# Patient Record
Sex: Female | Born: 1955 | Race: White | Marital: Married | Smoking: Never smoker
Health system: Southern US, Community
[De-identification: ages and names within clinical notes are randomized; demographics above are authoritative.]

## PROBLEM LIST (undated history)

## (undated) DIAGNOSIS — Z860100 Personal history of colon polyps, unspecified: Secondary | ICD-10-CM

## (undated) DIAGNOSIS — I10 Essential (primary) hypertension: Secondary | ICD-10-CM

## (undated) DIAGNOSIS — G43909 Migraine, unspecified, not intractable, without status migrainosus: Secondary | ICD-10-CM

## (undated) DIAGNOSIS — G510 Bell's palsy: Secondary | ICD-10-CM

## (undated) DIAGNOSIS — M1990 Unspecified osteoarthritis, unspecified site: Secondary | ICD-10-CM

## (undated) DIAGNOSIS — Z8719 Personal history of other diseases of the digestive system: Secondary | ICD-10-CM

## (undated) DIAGNOSIS — E119 Type 2 diabetes mellitus without complications: Secondary | ICD-10-CM

## (undated) DIAGNOSIS — J9801 Acute bronchospasm: Secondary | ICD-10-CM

## (undated) DIAGNOSIS — D510 Vitamin B12 deficiency anemia due to intrinsic factor deficiency: Secondary | ICD-10-CM

## (undated) DIAGNOSIS — E785 Hyperlipidemia, unspecified: Secondary | ICD-10-CM

## (undated) DIAGNOSIS — T7840XA Allergy, unspecified, initial encounter: Secondary | ICD-10-CM

## (undated) DIAGNOSIS — K219 Gastro-esophageal reflux disease without esophagitis: Secondary | ICD-10-CM

## (undated) DIAGNOSIS — K5792 Diverticulitis of intestine, part unspecified, without perforation or abscess without bleeding: Secondary | ICD-10-CM

## (undated) DIAGNOSIS — J45909 Unspecified asthma, uncomplicated: Secondary | ICD-10-CM

## (undated) DIAGNOSIS — G4700 Insomnia, unspecified: Secondary | ICD-10-CM

## (undated) DIAGNOSIS — K589 Irritable bowel syndrome without diarrhea: Secondary | ICD-10-CM

## (undated) HISTORY — DX: Acute bronchospasm: J98.01

## (undated) HISTORY — DX: Insomnia, unspecified: G47.00

## (undated) HISTORY — DX: Bell's palsy: G51.0

## (undated) HISTORY — PX: BUNIONECTOMY: SHX129

## (undated) HISTORY — DX: Diverticulitis of intestine, part unspecified, without perforation or abscess without bleeding: K57.92

## (undated) HISTORY — DX: Hyperlipidemia, unspecified: E78.5

## (undated) HISTORY — DX: Migraine, unspecified, not intractable, without status migrainosus: G43.909

## (undated) HISTORY — PX: CATARACT EXTRACTION: SUR2

## (undated) HISTORY — DX: Allergy, unspecified, initial encounter: T78.40XA

## (undated) HISTORY — DX: Personal history of colon polyps, unspecified: Z86.0100

## (undated) HISTORY — DX: Personal history of other diseases of the digestive system: Z87.19

## (undated) HISTORY — DX: Unspecified osteoarthritis, unspecified site: M19.90

## (undated) HISTORY — DX: Vitamin B12 deficiency anemia due to intrinsic factor deficiency: D51.0

## (undated) HISTORY — DX: Irritable bowel syndrome, unspecified: K58.9

## (undated) HISTORY — DX: Gastro-esophageal reflux disease without esophagitis: K21.9

## (undated) HISTORY — PX: KNEE SURGERY: SHX244

## (undated) HISTORY — DX: Personal history of colonic polyps: Z86.010

---

## 1990-09-19 HISTORY — PX: ABDOMINAL HYSTERECTOMY: SHX81

## 2010-12-19 HISTORY — PX: CHOLECYSTECTOMY: SHX55

## 2011-01-06 ENCOUNTER — Other Ambulatory Visit (HOSPITAL_COMMUNITY): Payer: Self-pay | Admitting: Gastroenterology

## 2011-01-06 DIAGNOSIS — R1084 Generalized abdominal pain: Secondary | ICD-10-CM

## 2011-01-10 ENCOUNTER — Encounter (HOSPITAL_COMMUNITY)
Admission: RE | Admit: 2011-01-10 | Discharge: 2011-01-10 | Disposition: A | Discharge status: Home / Self Care | Payer: 59 | Source: Ambulatory Visit | Attending: Gastroenterology | Admitting: Gastroenterology

## 2011-01-10 DIAGNOSIS — R110 Nausea: Secondary | ICD-10-CM | POA: Insufficient documentation

## 2011-01-10 DIAGNOSIS — R1084 Generalized abdominal pain: Secondary | ICD-10-CM

## 2011-01-10 DIAGNOSIS — R945 Abnormal results of liver function studies: Secondary | ICD-10-CM | POA: Insufficient documentation

## 2011-01-10 DIAGNOSIS — R109 Unspecified abdominal pain: Secondary | ICD-10-CM | POA: Insufficient documentation

## 2011-01-10 MED ORDER — TECHNETIUM TC 99M MEBROFENIN IV KIT
5.0000 | PACK | Freq: Once | INTRAVENOUS | Status: AC | PRN
  Administered 2011-01-10: 5 via INTRAVENOUS

## 2011-01-18 ENCOUNTER — Other Ambulatory Visit (HOSPITAL_COMMUNITY): Payer: Self-pay

## 2012-03-27 IMAGING — NM NM HEPATO W/GB/PHARM/[PERSON_NAME]
3 series · 13 of 13 positions shown · non-contrast
Comparison: None.

CLINICAL DATA: Abdominal pain

NUCLEAR MEDICINE HEPATOBILIARY IMAGING WITH GALLBLADDER EF
TECHNIQUE: Sequential images of the abdomen were obtained [DATE] minutes following intravenous administration of
radiopharmaceutical.  After slow intravenous infusion of
micrograms Cholecystokinin, gallbladder ejection fraction was
determined.
Radiopharmaceutical:  5.0 mCi 3c-CCm Choletec

[he hepatobiliary · 3.43mm/px · 6 of 46 frames shown (1 of 3)]
[frame 4/46]
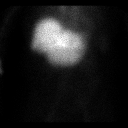
[frame 12/46]
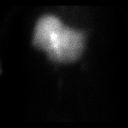
[frame 20/46]
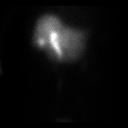
[frame 27/46]
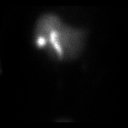
[frame 35/46]
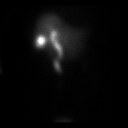
[frame 43/46]
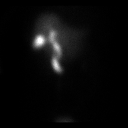

[he hepatobiliary · 1 of 1 slices shown (2 of 3)]
[im 1/1]
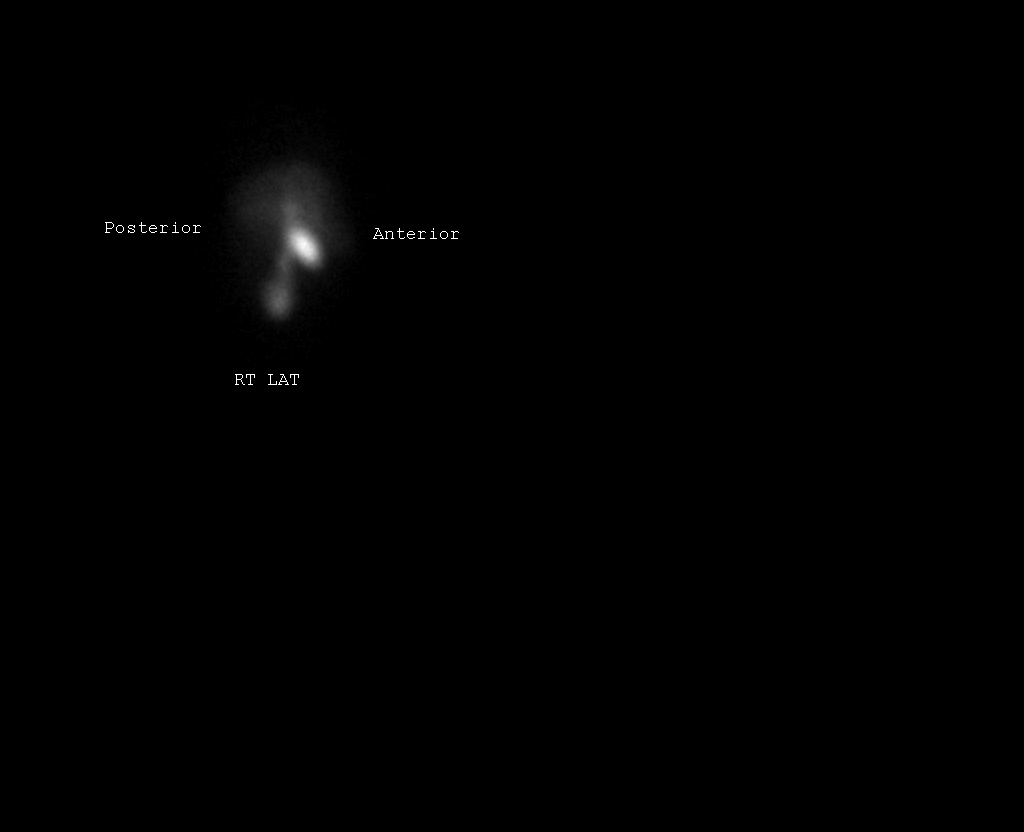

[he hepatobiliary · 3.43mm/px · 6 of 30 frames shown (3 of 3)]
[frame 3/30]
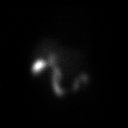
[frame 8/30]
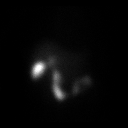
[frame 13/30]
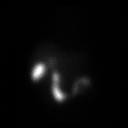
[frame 18/30]
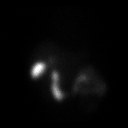
[frame 23/30]
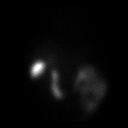
[frame 28/30]
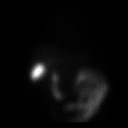

[13 of 13 positions shown; findings below may reference images not displayed]

FINDINGS: There is prompt visualization of the biliary tree,
gallbladder, and small bowel.

Ejection fraction is calculated at 16.4%.  Normal is greater than
or equal to 30%.

The patient experienced cramping and nausea symptoms during CCK
infusion.
IMPRESSION: 1.  Patent cystic and common bile ducts.
2.  Ejection fraction below normal.
3.  The patient reported cramping and nausea during CCK infusion.

## 2015-10-14 HISTORY — PX: COLONOSCOPY: SHX174

## 2016-01-20 DIAGNOSIS — J329 Chronic sinusitis, unspecified: Secondary | ICD-10-CM | POA: Diagnosis not present

## 2016-01-20 DIAGNOSIS — J40 Bronchitis, not specified as acute or chronic: Secondary | ICD-10-CM | POA: Diagnosis not present

## 2016-01-30 DIAGNOSIS — E119 Type 2 diabetes mellitus without complications: Secondary | ICD-10-CM | POA: Diagnosis not present

## 2016-02-23 DIAGNOSIS — G43909 Migraine, unspecified, not intractable, without status migrainosus: Secondary | ICD-10-CM | POA: Diagnosis not present

## 2016-02-23 DIAGNOSIS — R42 Dizziness and giddiness: Secondary | ICD-10-CM | POA: Diagnosis not present

## 2016-02-23 DIAGNOSIS — A09 Infectious gastroenteritis and colitis, unspecified: Secondary | ICD-10-CM | POA: Diagnosis not present

## 2016-03-15 DIAGNOSIS — Z1329 Encounter for screening for other suspected endocrine disorder: Secondary | ICD-10-CM | POA: Diagnosis not present

## 2017-04-02 DIAGNOSIS — T6394XA Toxic effect of contact with unspecified venomous animal, undetermined, initial encounter: Secondary | ICD-10-CM | POA: Diagnosis not present

## 2017-06-01 DIAGNOSIS — Z23 Encounter for immunization: Secondary | ICD-10-CM | POA: Diagnosis not present

## 2017-06-28 DIAGNOSIS — S60211A Contusion of right wrist, initial encounter: Secondary | ICD-10-CM | POA: Diagnosis not present

## 2017-06-28 DIAGNOSIS — S63501A Unspecified sprain of right wrist, initial encounter: Secondary | ICD-10-CM | POA: Diagnosis not present

## 2017-06-28 DIAGNOSIS — M25521 Pain in right elbow: Secondary | ICD-10-CM | POA: Diagnosis not present

## 2017-07-21 DIAGNOSIS — Z1331 Encounter for screening for depression: Secondary | ICD-10-CM | POA: Diagnosis not present

## 2017-07-21 DIAGNOSIS — M2550 Pain in unspecified joint: Secondary | ICD-10-CM | POA: Diagnosis not present

## 2017-07-21 DIAGNOSIS — I10 Essential (primary) hypertension: Secondary | ICD-10-CM | POA: Diagnosis not present

## 2017-07-26 DIAGNOSIS — E785 Hyperlipidemia, unspecified: Secondary | ICD-10-CM | POA: Diagnosis not present

## 2017-07-26 DIAGNOSIS — R51 Headache: Secondary | ICD-10-CM | POA: Diagnosis not present

## 2017-07-26 DIAGNOSIS — R0789 Other chest pain: Secondary | ICD-10-CM | POA: Diagnosis not present

## 2017-07-26 DIAGNOSIS — R112 Nausea with vomiting, unspecified: Secondary | ICD-10-CM | POA: Diagnosis not present

## 2017-07-26 DIAGNOSIS — G43909 Migraine, unspecified, not intractable, without status migrainosus: Secondary | ICD-10-CM | POA: Diagnosis not present

## 2017-07-26 DIAGNOSIS — R079 Chest pain, unspecified: Secondary | ICD-10-CM | POA: Diagnosis not present

## 2017-07-26 DIAGNOSIS — I200 Unstable angina: Secondary | ICD-10-CM | POA: Diagnosis not present

## 2017-07-26 DIAGNOSIS — I10 Essential (primary) hypertension: Secondary | ICD-10-CM | POA: Diagnosis not present

## 2017-07-26 DIAGNOSIS — J45909 Unspecified asthma, uncomplicated: Secondary | ICD-10-CM | POA: Diagnosis not present

## 2017-07-27 DIAGNOSIS — I10 Essential (primary) hypertension: Secondary | ICD-10-CM | POA: Diagnosis not present

## 2017-07-27 DIAGNOSIS — R079 Chest pain, unspecified: Secondary | ICD-10-CM | POA: Diagnosis not present

## 2017-07-27 DIAGNOSIS — G43909 Migraine, unspecified, not intractable, without status migrainosus: Secondary | ICD-10-CM | POA: Diagnosis not present

## 2017-08-02 DIAGNOSIS — I10 Essential (primary) hypertension: Secondary | ICD-10-CM | POA: Diagnosis not present

## 2017-08-02 DIAGNOSIS — F4320 Adjustment disorder, unspecified: Secondary | ICD-10-CM | POA: Diagnosis not present

## 2017-08-02 DIAGNOSIS — Z1339 Encounter for screening examination for other mental health and behavioral disorders: Secondary | ICD-10-CM | POA: Diagnosis not present

## 2017-08-02 DIAGNOSIS — Z1331 Encounter for screening for depression: Secondary | ICD-10-CM | POA: Diagnosis not present

## 2017-09-20 DIAGNOSIS — J329 Chronic sinusitis, unspecified: Secondary | ICD-10-CM | POA: Diagnosis not present

## 2017-09-20 DIAGNOSIS — J40 Bronchitis, not specified as acute or chronic: Secondary | ICD-10-CM | POA: Diagnosis not present

## 2017-10-03 DIAGNOSIS — S9032XA Contusion of left foot, initial encounter: Secondary | ICD-10-CM | POA: Diagnosis not present

## 2017-10-11 DIAGNOSIS — T07XXXA Unspecified multiple injuries, initial encounter: Secondary | ICD-10-CM | POA: Diagnosis not present

## 2017-12-01 DIAGNOSIS — E1165 Type 2 diabetes mellitus with hyperglycemia: Secondary | ICD-10-CM | POA: Diagnosis not present

## 2017-12-01 DIAGNOSIS — G43909 Migraine, unspecified, not intractable, without status migrainosus: Secondary | ICD-10-CM | POA: Diagnosis not present

## 2017-12-01 DIAGNOSIS — M2550 Pain in unspecified joint: Secondary | ICD-10-CM | POA: Diagnosis not present

## 2017-12-01 DIAGNOSIS — Z79899 Other long term (current) drug therapy: Secondary | ICD-10-CM | POA: Diagnosis not present

## 2017-12-05 DIAGNOSIS — R197 Diarrhea, unspecified: Secondary | ICD-10-CM | POA: Diagnosis not present

## 2017-12-19 DIAGNOSIS — R590 Localized enlarged lymph nodes: Secondary | ICD-10-CM | POA: Diagnosis not present

## 2017-12-19 DIAGNOSIS — Z6826 Body mass index (BMI) 26.0-26.9, adult: Secondary | ICD-10-CM | POA: Diagnosis not present

## 2017-12-19 DIAGNOSIS — Z9889 Other specified postprocedural states: Secondary | ICD-10-CM | POA: Diagnosis not present

## 2017-12-19 DIAGNOSIS — Z1231 Encounter for screening mammogram for malignant neoplasm of breast: Secondary | ICD-10-CM | POA: Diagnosis not present

## 2017-12-27 ENCOUNTER — Encounter: Payer: Self-pay | Admitting: Gastroenterology

## 2017-12-27 DIAGNOSIS — R599 Enlarged lymph nodes, unspecified: Secondary | ICD-10-CM | POA: Diagnosis not present

## 2017-12-27 DIAGNOSIS — N6489 Other specified disorders of breast: Secondary | ICD-10-CM | POA: Diagnosis not present

## 2017-12-27 DIAGNOSIS — R928 Other abnormal and inconclusive findings on diagnostic imaging of breast: Secondary | ICD-10-CM | POA: Diagnosis not present

## 2018-01-22 DIAGNOSIS — M79644 Pain in right finger(s): Secondary | ICD-10-CM | POA: Diagnosis not present

## 2018-02-02 ENCOUNTER — Ambulatory Visit: Payer: BLUE CROSS/BLUE SHIELD | Admitting: Gastroenterology

## 2018-02-09 ENCOUNTER — Encounter: Payer: Self-pay | Admitting: Gastroenterology

## 2018-02-14 ENCOUNTER — Ambulatory Visit (INDEPENDENT_AMBULATORY_CARE_PROVIDER_SITE_OTHER): Payer: BLUE CROSS/BLUE SHIELD | Admitting: Gastroenterology

## 2018-02-14 ENCOUNTER — Encounter: Payer: Self-pay | Admitting: Gastroenterology

## 2018-02-14 VITALS — BP 118/80 | HR 94 | Ht 63.5 in | Wt 153.1 lb

## 2018-02-14 DIAGNOSIS — R197 Diarrhea, unspecified: Secondary | ICD-10-CM

## 2018-02-14 MED ORDER — DIPHENOXYLATE-ATROPINE 2.5-0.025 MG PO TABS: 1.0000 | ORAL_TABLET | Freq: Four times a day (QID) | ORAL | 0 refills | Status: AC | PRN

## 2018-02-14 MED ORDER — DICYCLOMINE HCL 10 MG PO CAPS: ORAL_CAPSULE | ORAL | 4 refills | Status: DC

## 2018-02-14 NOTE — Patient Instructions (Signed)
If you are age 63 or older, your body mass index should be between 23-30. Your Body mass index is 26.7 kg/m. If this is out of the aforementioned range listed, please consider follow up with your Primary Care Provider.  If you are age 61 or younger, your body mass index should be between 19-25. Your Body mass index is 26.7 kg/m. If this is out of the aformentioned range listed, please consider follow up with your Primary Care Provider.   We have sent the following medications to your pharmacy for you to pick up at your convenience: Dicyclomine 10 mg four times daily 1/2 hour before meals and at bedtime.  Lomotil by mouth four times daily as needed.   Please call Dr. Donne Hazel nurse Christen Bame, RN)  in 2 weeks at [phone]  to let her now how you are doing.   Please stop all supplements and Omeprazole.   Thank you,  Dr. Lynann Bologna

## 2018-02-14 NOTE — Progress Notes (Signed)
IMPRESSION and PLAN:    #1. Diarrhea (previous history of constipation)- neg stool studies including WBCs 11/2017, failed viberzi and cholestyramine. Has been on supplements with magnesium, Nl CBC and CMP, neg colonoscopy 09/2015 except for mild sigmoid diverticulosis, normal TI. - dicyclomine  po qid half an hour before meals and at bedtime. - stop omeprazole. - Stop magnesium supplements and any other supplements.  Just take plain calcium - Lomotil-1 tablet p.o. 4 times daily as needed #30. - Call in 2 weeks.  If not better, check TSH, celiac screen, CRP and trial of gluten-free diet for 2 weeks.  If still not better, would proceed with colonoscopy with biopsies. If still with problems, would consider CT scan of the abdomen and pelvis.     #2. H/O IBS with predominant constipation, history of colonic polyps 01/2009,neg colonoscopy 09/2015 except for mild sigmoid diverticulosis, normal TI.  Next colonoscopy due 09/2025.      HPI:    Chief Complaint:   Nicole Hicks is a 62 y.o. female  With diarrhea over the last 6 months, without nocturnal symptoms 6-7/day, after eating, watery, no blood, mild abdominal discomfort mainly in the lower abdomen which gets better after defecation. No fever or chills Has been on multiple supplements including calcium with magnesium Negative stool studies, normal CBC, CMP. No nausea, vomiting, heartburn, regurgitation, odynophagia or dysphagia.  No constipation.  There is no melena or hematochezia. No unintentional weight loss. Given trial of Viberzi and cholestyramine without any relief. Bentyl helps with the pain Had negative colonoscopy as above Does get bloated with cheese, not much problems with milk.    Past Medical History:  Diagnosis Date  . Arthritis    hands and feet  . Bell's palsy    2 episodes  . Bronchospasm   . Diverticulitis   . History of colitis   . History of colon polyps   . IBS (irritable bowel syndrome)   . Insomnia     . Migraines   . Osteoarthritis   . Pernicious anemia     Current Outpatient Medications  Medication Sig Dispense Refill  . Ascorbic Acid (VITAMIN C PO) Take by mouth daily.    . baclofen (LIORESAL) 10 MG tablet Take 5-10 mg by mouth 2 (two) times daily as needed for muscle spasms.    . Biotin 5000 MCG TABS Take 5,000 mcg by mouth daily.    . Black Cohosh 40 MG CAPS Take 40 mg by mouth daily.    . Calcium Carbonate (CALCIUM 600 PO) Take 600 mg by mouth daily.    . celecoxib (CELEBREX) 200 MG capsule Take 200 mg by mouth daily as needed.    . Cholecalciferol (VITAMIN D3) 2000 units TABS Take 2,000 mg by mouth daily.    . Cinnamon 500 MG capsule Take 1,000 mg by mouth daily.    . citalopram (CELEXA) 20 MG tablet Take 40 mg by mouth daily.    Marland Kitchen dicyclomine (BENTYL) 20 MG tablet Take 20 mg by mouth every 6 (six) hours.    Dorise Hiss (AIMOVIG) 70 MG/ML SOAJ Inject 1 pen into the skin every 30 (thirty) days.    Marland Kitchen gabapentin (NEURONTIN) 100 MG capsule Take 100 mg by mouth at bedtime.    Marland Kitchen loratadine (CLARITIN) 10 MG tablet Take 10 mg by mouth daily.    . Metoprolol Succinate 25 MG CS24 Take 25 mg by mouth daily.    . montelukast (SINGULAIR) 10 MG tablet Take 10 mg  by mouth at bedtime.    . Multiple Vitamin (MULTIVITAMIN) tablet Take 1 tablet by mouth daily.    . naproxen (NAPROSYN) 500 MG tablet Take 500 mg by mouth 2 (two) times daily as needed.    Marland Kitchen omeprazole (PRILOSEC) 20 MG capsule Take 20 mg by mouth 2 (two) times daily as needed.    . ondansetron (ZOFRAN) 8 MG tablet Take 8 mg by mouth 2 (two) times daily as needed for nausea or vomiting.    . sitaGLIPtin (JANUVIA) 100 MG tablet Take 100 mg by mouth daily.    . Turmeric Curcumin 500 MG CAPS Take 500 mg by mouth daily.    Marland Kitchen zonisamide (ZONEGRAN) 25 MG capsule Take 25 mg by mouth daily.     No current facility-administered medications for this visit.     Past Surgical History:  Procedure Laterality Date  . ABDOMINAL  HYSTERECTOMY  1992  . CHOLECYSTECTOMY  12/2010  . COLONOSCOPY  10/14/2015   Mild sigmoid diverticulosis.     Family History  Problem Relation Age of Onset  . Diabetes Mother   . Heart disease Mother   . Diabetes Father   . Heart disease Father   . Colon cancer Neg Hx     Social History   Tobacco Use  . Smoking status: Never Smoker  . Smokeless tobacco: Never Used  Substance Use Topics  . Alcohol use: Not on file  . Drug use: Never    Allergies  Allergen Reactions  . Ultram [Tramadol Hcl] Hives  . Tape Rash    Surgical tape     Review of Systems: All systems reviewed and negative except where noted in HPI.    Physical Exam:     BP 118/80   Pulse 94   Ht 5' 3.5" (1.613 m)   Wt 153 lb 2 oz (69.5 kg)   BMI 26.70 kg/m  @ GENERAL:  Alert, oriented, cooperative, not in acute distress. PSYCH: :Pleasant, normal mood and affect. HEENT:  conjunctiva pink, mucous membranes moist, neck supple without masses. No jaundice. CARDIAC:  S1 S2 normal. No murmers. PULM: Normal respiratory effort, lungs CTA bilaterally, no wheezing. ABDOMEN: Inspection: No visible peristalsis, no abnormal pulsations, skin normal.  Palpation/percussion: Soft, nontender, nondistended, no rigidity, no abnormal dullness to percussion, no hepatosplenomegaly and no palpable abdominal masses.  Auscultation: Normal bowel sounds, no abdominal bruits. Rectal exam: Deferred SKIN:  turgor, no lesions seen. Musculoskeletal:  Normal muscle tone, normal strength. NEURO: Alert and oriented x 3, no focal neurologic deficits.    Agapito Hanway,MD 02/14/2018, 10:06 AM   CC Lise Auer, MD

## 2018-02-21 DIAGNOSIS — L723 Sebaceous cyst: Secondary | ICD-10-CM | POA: Diagnosis not present

## 2018-02-22 ENCOUNTER — Telehealth: Payer: Self-pay | Admitting: Gastroenterology

## 2018-02-22 NOTE — Telephone Encounter (Signed)
Patient who works at [employer] states she needs to speak with the nurse. No further details were given. Saw Dr.Gupta 5.29.19

## 2018-02-28 ENCOUNTER — Telehealth: Payer: Self-pay | Admitting: Gastroenterology

## 2018-02-28 ENCOUNTER — Telehealth: Payer: Self-pay

## 2018-02-28 NOTE — Telephone Encounter (Signed)
Patient aware it is OK to start adding meds back.  Will start with omeprazole secondary to significant reflux since off.

## 2018-02-28 NOTE — Telephone Encounter (Signed)
She never returned my message.  Closed.

## 2018-02-28 NOTE — Telephone Encounter (Signed)
Returning call. Please call back to [phone] or [phone].

## 2018-02-28 NOTE — Telephone Encounter (Signed)
Let's add one at a time. Wait for at least 2 weeks in between

## 2018-02-28 NOTE — Telephone Encounter (Signed)
She called with a 2-week report.  She is better, has only had a "few" episodes of diarrhea since making the changes you ordered at last office visit.  She has not taken any Lomotil for about a week  She wants to know if she can start taking any of her supplements (MVI, Vit. E, Vit. C)?  Also, wanted to let you know there is no magnesium in her calcium supplement.  Please advise.  Thank you.

## 2018-04-09 DIAGNOSIS — R300 Dysuria: Secondary | ICD-10-CM | POA: Diagnosis not present

## 2018-04-18 DIAGNOSIS — L309 Dermatitis, unspecified: Secondary | ICD-10-CM | POA: Diagnosis not present

## 2018-04-27 DIAGNOSIS — F4320 Adjustment disorder, unspecified: Secondary | ICD-10-CM | POA: Diagnosis not present

## 2018-04-27 DIAGNOSIS — N951 Menopausal and female climacteric states: Secondary | ICD-10-CM | POA: Diagnosis not present

## 2018-04-27 DIAGNOSIS — I10 Essential (primary) hypertension: Secondary | ICD-10-CM | POA: Diagnosis not present

## 2018-04-27 DIAGNOSIS — Z79899 Other long term (current) drug therapy: Secondary | ICD-10-CM | POA: Diagnosis not present

## 2018-05-18 DIAGNOSIS — R300 Dysuria: Secondary | ICD-10-CM | POA: Diagnosis not present

## 2018-05-24 DIAGNOSIS — M65311 Trigger thumb, right thumb: Secondary | ICD-10-CM | POA: Diagnosis not present

## 2018-06-01 DIAGNOSIS — G4762 Sleep related leg cramps: Secondary | ICD-10-CM | POA: Diagnosis not present

## 2018-06-01 DIAGNOSIS — N764 Abscess of vulva: Secondary | ICD-10-CM | POA: Diagnosis not present

## 2018-06-06 DIAGNOSIS — R52 Pain, unspecified: Secondary | ICD-10-CM | POA: Diagnosis not present

## 2018-06-06 DIAGNOSIS — K589 Irritable bowel syndrome without diarrhea: Secondary | ICD-10-CM | POA: Diagnosis not present

## 2018-06-27 DIAGNOSIS — Z23 Encounter for immunization: Secondary | ICD-10-CM | POA: Diagnosis not present

## 2018-07-02 DIAGNOSIS — T63481A Toxic effect of venom of other arthropod, accidental (unintentional), initial encounter: Secondary | ICD-10-CM | POA: Diagnosis not present

## 2018-07-05 DIAGNOSIS — B9789 Other viral agents as the cause of diseases classified elsewhere: Secondary | ICD-10-CM | POA: Diagnosis not present

## 2018-07-05 DIAGNOSIS — J069 Acute upper respiratory infection, unspecified: Secondary | ICD-10-CM | POA: Diagnosis not present

## 2018-07-26 ENCOUNTER — Ambulatory Visit: Payer: BLUE CROSS/BLUE SHIELD | Admitting: Gastroenterology

## 2018-07-27 DIAGNOSIS — Z23 Encounter for immunization: Secondary | ICD-10-CM | POA: Diagnosis not present

## 2018-08-05 DIAGNOSIS — S6292XA Unspecified fracture of left wrist and hand, initial encounter for closed fracture: Secondary | ICD-10-CM | POA: Diagnosis not present

## 2018-08-08 DIAGNOSIS — S62307A Unspecified fracture of fifth metacarpal bone, left hand, initial encounter for closed fracture: Secondary | ICD-10-CM | POA: Diagnosis not present

## 2018-08-08 DIAGNOSIS — M79642 Pain in left hand: Secondary | ICD-10-CM | POA: Diagnosis not present

## 2018-08-13 ENCOUNTER — Encounter: Payer: Self-pay | Admitting: Gastroenterology

## 2018-08-13 ENCOUNTER — Ambulatory Visit (INDEPENDENT_AMBULATORY_CARE_PROVIDER_SITE_OTHER): Payer: BLUE CROSS/BLUE SHIELD | Admitting: Gastroenterology

## 2018-08-13 VITALS — BP 156/100 | HR 67 | Ht 63.0 in | Wt 164.1 lb

## 2018-08-13 DIAGNOSIS — R197 Diarrhea, unspecified: Secondary | ICD-10-CM

## 2018-08-13 MED ORDER — PANCRELIPASE (LIP-PROT-AMYL) 40000-126000 UNITS PO CPEP: 40000.0000 [IU] | ORAL_CAPSULE | Freq: Three times a day (TID) | ORAL | 0 refills | Status: DC

## 2018-08-13 NOTE — Patient Instructions (Signed)
If you are age 63 or older, your body mass index should be between 23-30. Your Body mass index is 29.07 kg/m. If this is out of the aforementioned range listed, please consider follow up with your Primary Care Provider.  If you are age 63 or younger, your body mass index should be between 19-25. Your Body mass index is 29.07 kg/m. If this is out of the aformentioned range listed, please consider follow up with your Primary Care Provider.   We have given you samples of the following medication to take: Zenpep 40,000 three times daily with meals.   If you are not better in 2 weeks have the following bloodwork drawn:  CBC.TSH,CMP,CRP,Celiac Panel  Thank you,  Dr. Lynann Bolognaajesh Gupta

## 2018-08-13 NOTE — Progress Notes (Signed)
IMPRESSION and PLAN:    #1. Diarrhea (previous history of constipation)- neg stool studies including WBCs 11/2017, failed viberzi and cholestyramine. Has been on supplements with magnesium, Nl CBC and CMP, neg colonoscopy 09/2015 except for mild sigmoid diverticulosis, normal TI. - dicyclomine 10mg  po qid half an hour before meals and at bedtime. - Stop magnesium supplements and any other supplements.  Just take plain calcium - Lomotil-1 tablet p.o. 4 times daily as needed #30. - Check CBC, CMP, TSH, celiac screen, CRP.  She works in [employer].  She will get it done from there and send us a copy. - Zenpep (lipase 40,000) 1 with each meal.  Samples were given.  - Trial of gluten-free diet for 2 weeks, if not better. - Follow-up in 12 weeks.  Earlier, if still with problems.     #2. H/O IBS with predominant constipation, history of colonic polyps 01/2009,neg colonoscopy 09/2015 except for mild sigmoid diverticulosis, normal TI.  Next colonoscopy due 09/2025.      HPI:    Chief Complaint:   Sinda DuSheila Golding is a 62 y.o. female  For follow-up visit Is about 50% better Unfortunately, fell while in [place] and had left wrist fracture and is currently in a cast. Has been taking pain medicines which has slowed down the diarrhea Negative stool studies, normal CBC, CMP. No nausea, vomiting, heartburn, regurgitation, odynophagia or dysphagia.  No constipation.  There is no melena or hematochezia. No unintentional weight loss. Given trial of Viberzi and cholestyramine without any relief. Bentyl helps with the pain Had negative colonoscopy as above Does get bloated with cheese, not much problems with milk. Has restarted taking omeprazole since she had reflux.    Past Medical History:  Diagnosis Date  . Arthritis    hands and feet  . Bell's palsy    2 episodes  . Bronchospasm   . Diverticulitis   . History of colitis   . History of colon polyps   . IBS (irritable bowel  syndrome)   . Insomnia   . Migraines   . Osteoarthritis   . Pernicious anemia     Current Outpatient Medications  Medication Sig Dispense Refill  . Ascorbic Acid (VITAMIN C PO) Take by mouth daily.    . baclofen (LIORESAL) 10 MG tablet Take 5-10 mg by mouth 2 (two) times daily as needed for muscle spasms.    . Biotin 5000 MCG TABS Take 5,000 mcg by mouth daily.    . Calcium Carbonate (CALCIUM 600 PO) Take 600 mg by mouth daily.    . Cholecalciferol (VITAMIN D3) 2000 units TABS Take 2,000 mg by mouth daily.    . Cinnamon 500 MG capsule Take 1,000 mg by mouth daily.    . citalopram (CELEXA) 20 MG tablet Take 40 mg by mouth daily.    Marland Kitchen. dicyclomine (BENTYL) 10 MG capsule Take four times daily 1/2 hour before meals and at bedtime. (Patient taking differently: as needed. Take four times daily 1/2 hour before meals and at bedtime.) 120 capsule 4  . dicyclomine (BENTYL) 20 MG tablet Take 20 mg by mouth as needed.     . diphenoxylate-atropine (LOMOTIL) 2.5-0.025 MG tablet Take 1 tablet by mouth 4 (four) times daily as needed for diarrhea or loose stools. (Patient taking differently: Take 1 tablet by mouth as needed for diarrhea or loose stools. ) 30 tablet 0  . Erenumab-aooe (AIMOVIG) 70 MG/ML SOAJ Inject 1 pen into the skin every 30 (  thirty) days.    Marland Kitchen gabapentin (NEURONTIN) 100 MG capsule Take 100 mg by mouth at bedtime.    Marland Kitchen loratadine (CLARITIN) 10 MG tablet Take 10 mg by mouth daily.    . Metoprolol Succinate 25 MG CS24 Take 25 mg by mouth daily.    . montelukast (SINGULAIR) 10 MG tablet Take 10 mg by mouth at bedtime.    . Multiple Vitamin (MULTIVITAMIN) tablet Take 1 tablet by mouth daily.    . naproxen (NAPROSYN) 500 MG tablet Take 500 mg by mouth 2 (two) times daily as needed.    Marland Kitchen omeprazole (PRILOSEC) 20 MG capsule Take 20 mg by mouth 2 (two) times daily as needed.    . ondansetron (ZOFRAN) 8 MG tablet Take 8 mg by mouth as needed for nausea or vomiting.     . sitaGLIPtin (JANUVIA) 100  MG tablet Take 100 mg by mouth daily.    . Turmeric Curcumin 500 MG CAPS Take 500 mg by mouth daily.    Marland Kitchen zonisamide (ZONEGRAN) 25 MG capsule Take 25 mg by mouth daily.    Marland Kitchen estradiol (ESTRACE) 0.5 MG tablet Take by mouth daily.     No current facility-administered medications for this visit.     Past Surgical History:  Procedure Laterality Date  . ABDOMINAL HYSTERECTOMY  1992  . CHOLECYSTECTOMY  12/2010  . COLONOSCOPY  10/14/2015   Mild sigmoid diverticulosis.     Family History  Problem Relation Age of Onset  . Diabetes Mother   . Heart disease Mother   . Diabetes Father   . Heart disease Father   . Colon cancer Neg Hx     Social History   Tobacco Use  . Smoking status: Never Smoker  . Smokeless tobacco: Never Used  Substance Use Topics  . Alcohol use: Not Currently  . Drug use: Never    Allergies  Allergen Reactions  . Meloxicam Hives  . Onabotulinumtoxina Swelling    Face swelling, ptosis   . Penicillins Hives and Rash  . Sulfamethoxazole-Trimethoprim Hives  . Ultram [Tramadol Hcl] Hives  . Hydrocodone-Acetaminophen Itching  . Tape Rash    Surgical tape     Review of Systems: All systems reviewed and negative except where noted in HPI.    Physical Exam:     BP (!) 156/100   Pulse 67   Ht 5\' 3"  (1.6 m)   Wt 164 lb 2 oz (74.4 kg)   BMI 29.07 kg/m    GENERAL:  Alert, oriented, cooperative, not in acute distress. PSYCH: :Pleasant, normal mood and affect. HEENT:  conjunctiva pink, mucous membranes moist, neck supple without masses. No jaundice. CARDIAC:  S1 S2 normal. No murmers. PULM: Normal respiratory effort, lungs CTA bilaterally, no wheezing. ABDOMEN: Inspection: No visible peristalsis, no abnormal pulsations, skin normal.  Palpation/percussion: Soft, nontender, nondistended, no rigidity, no abnormal dullness to percussion, no hepatosplenomegaly and no palpable abdominal masses.  Auscultation: Normal bowel sounds, no abdominal bruits. Rectal  exam: Deferred SKIN:  turgor, no lesions seen. Musculoskeletal:  Normal muscle tone, normal strength. NEURO: Alert and oriented x 3, no focal neurologic deficits.    Maleea Camilo,MD 08/13/2018, 4:18 PM   CC Lise Auer, MD

## 2018-08-15 ENCOUNTER — Telehealth: Payer: Self-pay | Admitting: Gastroenterology

## 2018-08-15 NOTE — Telephone Encounter (Signed)
Called and spoke with patient-patient reports since starting the Zenpap she has had increased nausea, developed a headache, has had an increase in pain in her broken hand (especially her thumb as of 08/15/18) and has been having "horrible gas pain, gas bloating, and horrible smell to the gas"; patient states these symptoms did not start until after starting the medication on Monday 08/13/18 pm; patient states she is "not going to take anymore until I hear from the doctor about why I am having these side effects because I Googled the side effects and I am having some of the exact ones that are on the list";  Please advise

## 2018-08-15 NOTE — Telephone Encounter (Signed)
Please clarify "some digestive enzymes" in order to educate the patient on medication regimen; Thank you

## 2018-08-15 NOTE — Telephone Encounter (Signed)
Hold off on Zenpep for now. She can just take some digestive enzymes OTC starting next week. Resume rest of the medications.

## 2018-08-20 NOTE — Telephone Encounter (Signed)
Left message for patient to call back  

## 2018-08-20 NOTE — Telephone Encounter (Signed)
I believe she had digestive enzymes (over-the-counter, [employer] brand).  She can take it if she desires 1/day with meals.  Hold off on Zenpep.

## 2018-08-22 NOTE — Telephone Encounter (Signed)
Left message for patient to call back  

## 2018-08-22 NOTE — Telephone Encounter (Signed)
Patient returned call to office-patient informed of MD recommendations; patient is agreeable with plan of care; patient verbalized understanding of instructions/information; patient advised to call back if questions/concerns arise;

## 2018-08-27 DIAGNOSIS — S62347A Nondisplaced fracture of base of fifth metacarpal bone. left hand, initial encounter for closed fracture: Secondary | ICD-10-CM | POA: Diagnosis not present

## 2018-08-27 DIAGNOSIS — M79642 Pain in left hand: Secondary | ICD-10-CM | POA: Diagnosis not present

## 2018-08-30 DIAGNOSIS — Z23 Encounter for immunization: Secondary | ICD-10-CM | POA: Diagnosis not present

## 2018-09-13 DIAGNOSIS — S62347A Nondisplaced fracture of base of fifth metacarpal bone. left hand, initial encounter for closed fracture: Secondary | ICD-10-CM | POA: Diagnosis not present

## 2018-10-18 DIAGNOSIS — S62347A Nondisplaced fracture of base of fifth metacarpal bone. left hand, initial encounter for closed fracture: Secondary | ICD-10-CM | POA: Diagnosis not present

## 2018-10-18 DIAGNOSIS — M79642 Pain in left hand: Secondary | ICD-10-CM | POA: Diagnosis not present

## 2018-10-31 DIAGNOSIS — N3091 Cystitis, unspecified with hematuria: Secondary | ICD-10-CM | POA: Diagnosis not present

## 2018-10-31 DIAGNOSIS — Z6826 Body mass index (BMI) 26.0-26.9, adult: Secondary | ICD-10-CM | POA: Diagnosis not present

## 2018-10-31 DIAGNOSIS — B372 Candidiasis of skin and nail: Secondary | ICD-10-CM | POA: Diagnosis not present

## 2018-11-26 DIAGNOSIS — J40 Bronchitis, not specified as acute or chronic: Secondary | ICD-10-CM | POA: Diagnosis not present

## 2018-11-26 DIAGNOSIS — J329 Chronic sinusitis, unspecified: Secondary | ICD-10-CM | POA: Diagnosis not present

## 2018-12-06 DIAGNOSIS — M549 Dorsalgia, unspecified: Secondary | ICD-10-CM | POA: Diagnosis not present

## 2018-12-24 DIAGNOSIS — S60211A Contusion of right wrist, initial encounter: Secondary | ICD-10-CM | POA: Diagnosis not present

## 2018-12-24 DIAGNOSIS — H43811 Vitreous degeneration, right eye: Secondary | ICD-10-CM | POA: Diagnosis not present

## 2018-12-24 DIAGNOSIS — Z8669 Personal history of other diseases of the nervous system and sense organs: Secondary | ICD-10-CM | POA: Diagnosis not present

## 2018-12-31 DIAGNOSIS — H43811 Vitreous degeneration, right eye: Secondary | ICD-10-CM | POA: Diagnosis not present

## 2019-01-02 DIAGNOSIS — R0781 Pleurodynia: Secondary | ICD-10-CM | POA: Diagnosis not present

## 2019-02-01 DIAGNOSIS — H43811 Vitreous degeneration, right eye: Secondary | ICD-10-CM | POA: Diagnosis not present

## 2019-03-07 DIAGNOSIS — Z1159 Encounter for screening for other viral diseases: Secondary | ICD-10-CM | POA: Diagnosis not present

## 2019-03-07 DIAGNOSIS — K589 Irritable bowel syndrome without diarrhea: Secondary | ICD-10-CM | POA: Diagnosis not present

## 2019-03-07 DIAGNOSIS — E119 Type 2 diabetes mellitus without complications: Secondary | ICD-10-CM | POA: Diagnosis not present

## 2019-03-07 DIAGNOSIS — L299 Pruritus, unspecified: Secondary | ICD-10-CM | POA: Diagnosis not present

## 2019-03-27 DIAGNOSIS — Z20828 Contact with and (suspected) exposure to other viral communicable diseases: Secondary | ICD-10-CM | POA: Diagnosis not present

## 2019-03-28 DIAGNOSIS — L739 Follicular disorder, unspecified: Secondary | ICD-10-CM | POA: Diagnosis not present

## 2019-06-27 DIAGNOSIS — M2550 Pain in unspecified joint: Secondary | ICD-10-CM | POA: Diagnosis not present

## 2019-07-04 DIAGNOSIS — Z23 Encounter for immunization: Secondary | ICD-10-CM | POA: Diagnosis not present

## 2019-07-09 DIAGNOSIS — Z6826 Body mass index (BMI) 26.0-26.9, adult: Secondary | ICD-10-CM | POA: Diagnosis not present

## 2019-07-09 DIAGNOSIS — S60221A Contusion of right hand, initial encounter: Secondary | ICD-10-CM | POA: Diagnosis not present

## 2019-08-12 DIAGNOSIS — M79605 Pain in left leg: Secondary | ICD-10-CM | POA: Diagnosis not present

## 2019-08-12 DIAGNOSIS — M898X1 Other specified disorders of bone, shoulder: Secondary | ICD-10-CM | POA: Diagnosis not present

## 2019-08-12 DIAGNOSIS — I10 Essential (primary) hypertension: Secondary | ICD-10-CM | POA: Diagnosis not present

## 2019-08-12 DIAGNOSIS — M25522 Pain in left elbow: Secondary | ICD-10-CM | POA: Diagnosis not present

## 2019-09-04 DIAGNOSIS — N898 Other specified noninflammatory disorders of vagina: Secondary | ICD-10-CM | POA: Diagnosis not present

## 2019-09-04 DIAGNOSIS — N907 Vulvar cyst: Secondary | ICD-10-CM | POA: Diagnosis not present

## 2019-09-05 DIAGNOSIS — N907 Vulvar cyst: Secondary | ICD-10-CM | POA: Diagnosis not present

## 2019-09-05 DIAGNOSIS — L720 Epidermal cyst: Secondary | ICD-10-CM | POA: Diagnosis not present

## 2019-09-25 DIAGNOSIS — L720 Epidermal cyst: Secondary | ICD-10-CM | POA: Diagnosis not present

## 2019-09-30 DIAGNOSIS — R29700 NIHSS score 0: Secondary | ICD-10-CM | POA: Diagnosis not present

## 2019-09-30 DIAGNOSIS — J45909 Unspecified asthma, uncomplicated: Secondary | ICD-10-CM | POA: Diagnosis not present

## 2019-09-30 DIAGNOSIS — R29810 Facial weakness: Secondary | ICD-10-CM | POA: Diagnosis not present

## 2019-09-30 DIAGNOSIS — Z79899 Other long term (current) drug therapy: Secondary | ICD-10-CM | POA: Diagnosis not present

## 2019-09-30 DIAGNOSIS — G43809 Other migraine, not intractable, without status migrainosus: Secondary | ICD-10-CM | POA: Diagnosis not present

## 2019-09-30 DIAGNOSIS — I10 Essential (primary) hypertension: Secondary | ICD-10-CM | POA: Diagnosis not present

## 2019-09-30 DIAGNOSIS — R519 Headache, unspecified: Secondary | ICD-10-CM | POA: Diagnosis not present

## 2019-09-30 DIAGNOSIS — R4781 Slurred speech: Secondary | ICD-10-CM | POA: Diagnosis not present

## 2019-09-30 DIAGNOSIS — E1165 Type 2 diabetes mellitus with hyperglycemia: Secondary | ICD-10-CM | POA: Diagnosis not present

## 2019-10-03 DIAGNOSIS — G4489 Other headache syndrome: Secondary | ICD-10-CM | POA: Diagnosis not present

## 2019-10-03 DIAGNOSIS — R42 Dizziness and giddiness: Secondary | ICD-10-CM | POA: Diagnosis not present

## 2019-10-24 DIAGNOSIS — Z1231 Encounter for screening mammogram for malignant neoplasm of breast: Secondary | ICD-10-CM | POA: Diagnosis not present

## 2019-10-31 DIAGNOSIS — G43109 Migraine with aura, not intractable, without status migrainosus: Secondary | ICD-10-CM | POA: Diagnosis not present

## 2019-10-31 DIAGNOSIS — R930 Abnormal findings on diagnostic imaging of skull and head, not elsewhere classified: Secondary | ICD-10-CM | POA: Diagnosis not present

## 2019-10-31 DIAGNOSIS — R42 Dizziness and giddiness: Secondary | ICD-10-CM | POA: Diagnosis not present

## 2019-10-31 DIAGNOSIS — R471 Dysarthria and anarthria: Secondary | ICD-10-CM | POA: Diagnosis not present

## 2019-11-12 DIAGNOSIS — Z7952 Long term (current) use of systemic steroids: Secondary | ICD-10-CM | POA: Diagnosis not present

## 2019-11-12 DIAGNOSIS — G4489 Other headache syndrome: Secondary | ICD-10-CM | POA: Diagnosis not present

## 2019-11-12 DIAGNOSIS — R29706 NIHSS score 6: Secondary | ICD-10-CM | POA: Diagnosis not present

## 2019-11-12 DIAGNOSIS — R52 Pain, unspecified: Secondary | ICD-10-CM | POA: Diagnosis not present

## 2019-11-12 DIAGNOSIS — E1165 Type 2 diabetes mellitus with hyperglycemia: Secondary | ICD-10-CM | POA: Diagnosis not present

## 2019-11-12 DIAGNOSIS — Z79890 Hormone replacement therapy (postmenopausal): Secondary | ICD-10-CM | POA: Diagnosis not present

## 2019-11-12 DIAGNOSIS — Z79899 Other long term (current) drug therapy: Secondary | ICD-10-CM | POA: Diagnosis not present

## 2019-11-12 DIAGNOSIS — G43109 Migraine with aura, not intractable, without status migrainosus: Secondary | ICD-10-CM | POA: Diagnosis not present

## 2019-11-12 DIAGNOSIS — I6522 Occlusion and stenosis of left carotid artery: Secondary | ICD-10-CM | POA: Diagnosis not present

## 2019-11-12 DIAGNOSIS — I10 Essential (primary) hypertension: Secondary | ICD-10-CM | POA: Diagnosis not present

## 2019-11-12 DIAGNOSIS — I6389 Other cerebral infarction: Secondary | ICD-10-CM | POA: Diagnosis not present

## 2019-11-12 DIAGNOSIS — I517 Cardiomegaly: Secondary | ICD-10-CM | POA: Diagnosis not present

## 2019-11-12 DIAGNOSIS — R519 Headache, unspecified: Secondary | ICD-10-CM | POA: Diagnosis not present

## 2019-11-12 DIAGNOSIS — I63411 Cerebral infarction due to embolism of right middle cerebral artery: Secondary | ICD-10-CM | POA: Diagnosis not present

## 2019-11-12 DIAGNOSIS — E785 Hyperlipidemia, unspecified: Secondary | ICD-10-CM | POA: Diagnosis not present

## 2019-11-12 DIAGNOSIS — I639 Cerebral infarction, unspecified: Secondary | ICD-10-CM

## 2019-11-12 DIAGNOSIS — E119 Type 2 diabetes mellitus without complications: Secondary | ICD-10-CM | POA: Diagnosis not present

## 2019-11-12 DIAGNOSIS — R4781 Slurred speech: Secondary | ICD-10-CM | POA: Diagnosis not present

## 2019-11-12 DIAGNOSIS — Z91048 Other nonmedicinal substance allergy status: Secondary | ICD-10-CM | POA: Diagnosis not present

## 2019-11-12 DIAGNOSIS — R29810 Facial weakness: Secondary | ICD-10-CM | POA: Diagnosis not present

## 2019-11-12 DIAGNOSIS — R9431 Abnormal electrocardiogram [ECG] [EKG]: Secondary | ICD-10-CM | POA: Diagnosis not present

## 2019-11-12 DIAGNOSIS — Z833 Family history of diabetes mellitus: Secondary | ICD-10-CM | POA: Diagnosis not present

## 2019-11-12 DIAGNOSIS — Z885 Allergy status to narcotic agent status: Secondary | ICD-10-CM | POA: Diagnosis not present

## 2019-11-12 DIAGNOSIS — Z880 Allergy status to penicillin: Secondary | ICD-10-CM | POA: Diagnosis not present

## 2019-11-12 DIAGNOSIS — G43909 Migraine, unspecified, not intractable, without status migrainosus: Secondary | ICD-10-CM | POA: Diagnosis not present

## 2019-11-12 DIAGNOSIS — E1142 Type 2 diabetes mellitus with diabetic polyneuropathy: Secondary | ICD-10-CM | POA: Diagnosis not present

## 2019-11-12 DIAGNOSIS — Z887 Allergy status to serum and vaccine status: Secondary | ICD-10-CM | POA: Diagnosis not present

## 2019-11-12 DIAGNOSIS — I63511 Cerebral infarction due to unspecified occlusion or stenosis of right middle cerebral artery: Secondary | ICD-10-CM | POA: Diagnosis not present

## 2019-11-12 HISTORY — DX: Cerebral infarction, unspecified: I63.9

## 2019-11-13 DIAGNOSIS — E119 Type 2 diabetes mellitus without complications: Secondary | ICD-10-CM | POA: Diagnosis not present

## 2019-11-13 DIAGNOSIS — E785 Hyperlipidemia, unspecified: Secondary | ICD-10-CM | POA: Diagnosis not present

## 2019-11-13 DIAGNOSIS — I63411 Cerebral infarction due to embolism of right middle cerebral artery: Secondary | ICD-10-CM | POA: Diagnosis not present

## 2019-11-13 DIAGNOSIS — R9431 Abnormal electrocardiogram [ECG] [EKG]: Secondary | ICD-10-CM | POA: Diagnosis not present

## 2019-11-13 DIAGNOSIS — R29810 Facial weakness: Secondary | ICD-10-CM | POA: Diagnosis not present

## 2019-11-13 DIAGNOSIS — G43909 Migraine, unspecified, not intractable, without status migrainosus: Secondary | ICD-10-CM | POA: Diagnosis not present

## 2019-11-13 DIAGNOSIS — I6389 Other cerebral infarction: Secondary | ICD-10-CM | POA: Diagnosis not present

## 2019-11-14 ENCOUNTER — Telehealth: Payer: Self-pay | Admitting: Gastroenterology

## 2019-11-14 DIAGNOSIS — E1165 Type 2 diabetes mellitus with hyperglycemia: Secondary | ICD-10-CM | POA: Diagnosis not present

## 2019-11-14 DIAGNOSIS — E785 Hyperlipidemia, unspecified: Secondary | ICD-10-CM | POA: Diagnosis not present

## 2019-11-14 DIAGNOSIS — I63511 Cerebral infarction due to unspecified occlusion or stenosis of right middle cerebral artery: Secondary | ICD-10-CM | POA: Diagnosis not present

## 2019-11-14 DIAGNOSIS — G43109 Migraine with aura, not intractable, without status migrainosus: Secondary | ICD-10-CM | POA: Diagnosis not present

## 2019-11-14 MED ORDER — ATORVASTATIN CALCIUM 40 MG PO TABS
80.00 | ORAL_TABLET | ORAL | Status: DC
Start: 2019-11-14 — End: 2019-11-14

## 2019-11-14 MED ORDER — CLOPIDOGREL BISULFATE 75 MG PO TABS
75.00 | ORAL_TABLET | ORAL | Status: DC
Start: 2019-11-15 — End: 2019-11-14

## 2019-11-14 MED ORDER — MONTELUKAST SODIUM 10 MG PO TABS
10.00 | ORAL_TABLET | ORAL | Status: DC
Start: 2019-11-14 — End: 2019-11-14

## 2019-11-14 MED ORDER — BACLOFEN 10 MG PO TABS
10.00 | ORAL_TABLET | ORAL | Status: DC
Start: ? — End: 2019-11-14

## 2019-11-14 MED ORDER — GENERIC EXTERNAL MEDICATION
Status: DC
Start: ? — End: 2019-11-14

## 2019-11-14 MED ORDER — ASPIRIN 81 MG PO CHEW
81.00 | CHEWABLE_TABLET | ORAL | Status: DC
Start: 2019-11-15 — End: 2019-11-14

## 2019-11-14 MED ORDER — ENOXAPARIN SODIUM 40 MG/0.4ML ~~LOC~~ SOLN
40.00 | SUBCUTANEOUS | Status: DC
Start: 2019-11-14 — End: 2019-11-14

## 2019-11-14 MED ORDER — PANTOPRAZOLE SODIUM 40 MG PO TBEC
40.00 | DELAYED_RELEASE_TABLET | ORAL | Status: DC
Start: 2019-11-15 — End: 2019-11-14

## 2019-11-14 MED ORDER — ONDANSETRON HCL 4 MG/2ML IJ SOLN
4.00 | INTRAMUSCULAR | Status: DC
Start: ? — End: 2019-11-14

## 2019-11-14 MED ORDER — ACETAMINOPHEN 325 MG PO TABS
650.00 | ORAL_TABLET | ORAL | Status: DC
Start: ? — End: 2019-11-14

## 2019-11-14 MED ORDER — GABAPENTIN 100 MG PO CAPS
100.00 | ORAL_CAPSULE | ORAL | Status: DC
Start: 2019-11-14 — End: 2019-11-14

## 2019-11-14 MED ORDER — CITALOPRAM HYDROBROMIDE 20 MG PO TABS
40.00 | ORAL_TABLET | ORAL | Status: DC
Start: 2019-11-15 — End: 2019-11-14

## 2019-11-14 NOTE — Telephone Encounter (Signed)
Left message for patient to call back to the office;  

## 2019-11-14 NOTE — Telephone Encounter (Signed)
Patient wants to speak with you about her symptoms- she is having severe diarrhea.

## 2019-11-14 NOTE — Telephone Encounter (Signed)
Called and spoke with patient-patient reports she is at [facility] currently for a stroke and is needing advice on her diarrhea and "no one here is helping me with this";  Patient reports the staff at the hospital have not given her any Imodium or Lomotil for the diarrhea;  Does not think it is diet related, is under stress, was having diarrhea "bad before I had to go to the hospital for the stroke"; patient reports she is going to be transferred to rehab tomorrow and is needing advice";   Please advise

## 2019-11-15 DIAGNOSIS — G43109 Migraine with aura, not intractable, without status migrainosus: Secondary | ICD-10-CM | POA: Diagnosis not present

## 2019-11-15 DIAGNOSIS — I63411 Cerebral infarction due to embolism of right middle cerebral artery: Secondary | ICD-10-CM | POA: Diagnosis not present

## 2019-11-15 DIAGNOSIS — R531 Weakness: Secondary | ICD-10-CM | POA: Diagnosis not present

## 2019-11-15 DIAGNOSIS — I69344 Monoplegia of lower limb following cerebral infarction affecting left non-dominant side: Secondary | ICD-10-CM | POA: Diagnosis not present

## 2019-11-15 DIAGNOSIS — I69318 Other symptoms and signs involving cognitive functions following cerebral infarction: Secondary | ICD-10-CM | POA: Diagnosis not present

## 2019-11-15 DIAGNOSIS — I69328 Other speech and language deficits following cerebral infarction: Secondary | ICD-10-CM | POA: Diagnosis not present

## 2019-11-15 DIAGNOSIS — E119 Type 2 diabetes mellitus without complications: Secondary | ICD-10-CM | POA: Diagnosis not present

## 2019-11-15 DIAGNOSIS — R5383 Other fatigue: Secondary | ICD-10-CM | POA: Diagnosis not present

## 2019-11-15 DIAGNOSIS — I10 Essential (primary) hypertension: Secondary | ICD-10-CM | POA: Diagnosis not present

## 2019-11-15 DIAGNOSIS — Z789 Other specified health status: Secondary | ICD-10-CM | POA: Diagnosis not present

## 2019-11-15 DIAGNOSIS — Z7902 Long term (current) use of antithrombotics/antiplatelets: Secondary | ICD-10-CM | POA: Diagnosis not present

## 2019-11-15 DIAGNOSIS — Z7409 Other reduced mobility: Secondary | ICD-10-CM | POA: Diagnosis not present

## 2019-11-15 DIAGNOSIS — E785 Hyperlipidemia, unspecified: Secondary | ICD-10-CM | POA: Diagnosis not present

## 2019-11-15 DIAGNOSIS — E1142 Type 2 diabetes mellitus with diabetic polyneuropathy: Secondary | ICD-10-CM | POA: Diagnosis not present

## 2019-11-15 DIAGNOSIS — Z7982 Long term (current) use of aspirin: Secondary | ICD-10-CM | POA: Diagnosis not present

## 2019-11-15 DIAGNOSIS — G43709 Chronic migraine without aura, not intractable, without status migrainosus: Secondary | ICD-10-CM | POA: Diagnosis not present

## 2019-11-15 DIAGNOSIS — R9431 Abnormal electrocardiogram [ECG] [EKG]: Secondary | ICD-10-CM | POA: Diagnosis not present

## 2019-11-15 DIAGNOSIS — G510 Bell's palsy: Secondary | ICD-10-CM | POA: Diagnosis not present

## 2019-11-15 DIAGNOSIS — I639 Cerebral infarction, unspecified: Secondary | ICD-10-CM | POA: Diagnosis not present

## 2019-11-15 NOTE — Telephone Encounter (Signed)
Thanks for letting me know Hope she feels better Can take Imodium AD 3 times a day for now. If not better in 1 week, we will add Lomotil  RG

## 2019-11-15 NOTE — Telephone Encounter (Signed)
Attempted to reach patient-unable to leave a VM as mailbox is full-will attempt to reach patient at a later date/time;   

## 2019-11-16 DIAGNOSIS — E1142 Type 2 diabetes mellitus with diabetic polyneuropathy: Secondary | ICD-10-CM | POA: Diagnosis not present

## 2019-11-16 DIAGNOSIS — I69344 Monoplegia of lower limb following cerebral infarction affecting left non-dominant side: Secondary | ICD-10-CM | POA: Diagnosis not present

## 2019-11-19 DIAGNOSIS — I639 Cerebral infarction, unspecified: Secondary | ICD-10-CM | POA: Diagnosis not present

## 2019-11-19 DIAGNOSIS — I69344 Monoplegia of lower limb following cerebral infarction affecting left non-dominant side: Secondary | ICD-10-CM | POA: Diagnosis not present

## 2019-11-19 DIAGNOSIS — E1142 Type 2 diabetes mellitus with diabetic polyneuropathy: Secondary | ICD-10-CM | POA: Diagnosis not present

## 2019-11-19 DIAGNOSIS — I10 Essential (primary) hypertension: Secondary | ICD-10-CM | POA: Diagnosis not present

## 2019-11-19 DIAGNOSIS — Z7409 Other reduced mobility: Secondary | ICD-10-CM | POA: Diagnosis not present

## 2019-11-19 DIAGNOSIS — E119 Type 2 diabetes mellitus without complications: Secondary | ICD-10-CM | POA: Diagnosis not present

## 2019-11-19 MED ORDER — TOPIRAMATE 25 MG PO TABS
25.00 | ORAL_TABLET | ORAL | Status: DC
Start: 2019-11-21 — End: 2019-11-19

## 2019-11-19 MED ORDER — INSULIN LISPRO 100 UNIT/ML ~~LOC~~ SOLN
1.00 | SUBCUTANEOUS | Status: DC
Start: 2019-11-21 — End: 2019-11-19

## 2019-11-19 MED ORDER — MONTELUKAST SODIUM 10 MG PO TABS
10.00 | ORAL_TABLET | ORAL | Status: DC
Start: 2019-11-21 — End: 2019-11-19

## 2019-11-19 MED ORDER — CLOPIDOGREL BISULFATE 75 MG PO TABS
75.00 | ORAL_TABLET | ORAL | Status: DC
Start: 2019-11-22 — End: 2019-11-19

## 2019-11-19 MED ORDER — BACLOFEN 10 MG PO TABS
10.00 | ORAL_TABLET | ORAL | Status: DC
Start: ? — End: 2019-11-19

## 2019-11-19 MED ORDER — LISINOPRIL 5 MG PO TABS
5.00 | ORAL_TABLET | ORAL | Status: DC
Start: 2019-11-22 — End: 2019-11-19

## 2019-11-19 MED ORDER — FENOFIBRATE 54 MG PO TABS
54.00 | ORAL_TABLET | ORAL | Status: DC
Start: 2019-11-22 — End: 2019-11-19

## 2019-11-19 MED ORDER — ATORVASTATIN CALCIUM 40 MG PO TABS
80.00 | ORAL_TABLET | ORAL | Status: DC
Start: 2019-11-21 — End: 2019-11-19

## 2019-11-19 MED ORDER — GABAPENTIN 100 MG PO CAPS
100.00 | ORAL_CAPSULE | ORAL | Status: DC
Start: 2019-11-21 — End: 2019-11-19

## 2019-11-19 MED ORDER — ACETAMINOPHEN 325 MG PO TABS
650.00 | ORAL_TABLET | ORAL | Status: DC
Start: 2019-11-21 — End: 2019-11-19

## 2019-11-19 MED ORDER — LOPERAMIDE HCL 2 MG PO CAPS
2.00 | ORAL_CAPSULE | ORAL | Status: DC
Start: ? — End: 2019-11-19

## 2019-11-19 MED ORDER — BISACODYL 10 MG RE SUPP
10.00 | RECTAL | Status: DC
Start: ? — End: 2019-11-19

## 2019-11-19 MED ORDER — FAMOTIDINE 20 MG PO TABS
20.00 | ORAL_TABLET | ORAL | Status: DC
Start: 2019-11-21 — End: 2019-11-19

## 2019-11-19 MED ORDER — MELATONIN 3 MG PO TABS
6.00 | ORAL_TABLET | ORAL | Status: DC
Start: ? — End: 2019-11-19

## 2019-11-19 MED ORDER — POLYETHYLENE GLYCOL 3350 17 GM/SCOOP PO POWD
17.00 | ORAL | Status: DC
Start: ? — End: 2019-11-19

## 2019-11-19 MED ORDER — ONDANSETRON 4 MG PO TBDP
4.00 | ORAL_TABLET | ORAL | Status: DC
Start: ? — End: 2019-11-19

## 2019-11-19 MED ORDER — SENNOSIDES-DOCUSATE SODIUM 8.6-50 MG PO TABS
1.00 | ORAL_TABLET | ORAL | Status: DC
Start: ? — End: 2019-11-19

## 2019-11-19 MED ORDER — ASPIRIN 81 MG PO CHEW
81.00 | CHEWABLE_TABLET | ORAL | Status: DC
Start: 2019-11-22 — End: 2019-11-19

## 2019-11-19 MED ORDER — CITALOPRAM HYDROBROMIDE 20 MG PO TABS
40.00 | ORAL_TABLET | ORAL | Status: DC
Start: 2019-11-22 — End: 2019-11-19

## 2019-11-19 NOTE — Telephone Encounter (Signed)
Attempted to reach patient-unable to leave a VM as "maibox is full"- will attempt to reach patient at a later date/time;

## 2019-11-22 NOTE — Telephone Encounter (Signed)
Attempted to reach patient-unable to leave a VM as mailbox is full-will attempt to reach patient at a later date/time;   

## 2019-11-25 NOTE — Telephone Encounter (Signed)
Attempted to reach patient-unable to leave a VM-will wait for patient to call back to the office for advisement on plan of care;

## 2019-11-27 DIAGNOSIS — Z8673 Personal history of transient ischemic attack (TIA), and cerebral infarction without residual deficits: Secondary | ICD-10-CM | POA: Diagnosis not present

## 2019-11-27 DIAGNOSIS — E782 Mixed hyperlipidemia: Secondary | ICD-10-CM | POA: Diagnosis not present

## 2019-11-27 DIAGNOSIS — E1165 Type 2 diabetes mellitus with hyperglycemia: Secondary | ICD-10-CM | POA: Diagnosis not present

## 2019-11-27 DIAGNOSIS — G43909 Migraine, unspecified, not intractable, without status migrainosus: Secondary | ICD-10-CM | POA: Diagnosis not present

## 2019-11-27 DIAGNOSIS — K529 Noninfective gastroenteritis and colitis, unspecified: Secondary | ICD-10-CM | POA: Diagnosis not present

## 2019-12-04 DIAGNOSIS — G43109 Migraine with aura, not intractable, without status migrainosus: Secondary | ICD-10-CM | POA: Diagnosis not present

## 2019-12-04 DIAGNOSIS — I639 Cerebral infarction, unspecified: Secondary | ICD-10-CM | POA: Diagnosis not present

## 2019-12-28 DIAGNOSIS — I639 Cerebral infarction, unspecified: Secondary | ICD-10-CM | POA: Diagnosis not present

## 2020-01-13 DIAGNOSIS — Z23 Encounter for immunization: Secondary | ICD-10-CM | POA: Diagnosis not present

## 2020-03-17 DIAGNOSIS — I639 Cerebral infarction, unspecified: Secondary | ICD-10-CM | POA: Diagnosis not present

## 2020-03-17 DIAGNOSIS — G43709 Chronic migraine without aura, not intractable, without status migrainosus: Secondary | ICD-10-CM | POA: Diagnosis not present

## 2020-03-30 DIAGNOSIS — W57XXXA Bitten or stung by nonvenomous insect and other nonvenomous arthropods, initial encounter: Secondary | ICD-10-CM | POA: Diagnosis not present

## 2020-03-30 DIAGNOSIS — R21 Rash and other nonspecific skin eruption: Secondary | ICD-10-CM | POA: Diagnosis not present

## 2020-03-30 DIAGNOSIS — E1165 Type 2 diabetes mellitus with hyperglycemia: Secondary | ICD-10-CM | POA: Diagnosis not present

## 2020-04-02 DIAGNOSIS — G43909 Migraine, unspecified, not intractable, without status migrainosus: Secondary | ICD-10-CM | POA: Diagnosis not present

## 2020-04-16 DIAGNOSIS — G43119 Migraine with aura, intractable, without status migrainosus: Secondary | ICD-10-CM | POA: Diagnosis not present

## 2020-04-16 DIAGNOSIS — I639 Cerebral infarction, unspecified: Secondary | ICD-10-CM | POA: Diagnosis not present

## 2020-05-07 DIAGNOSIS — G43909 Migraine, unspecified, not intractable, without status migrainosus: Secondary | ICD-10-CM | POA: Diagnosis not present

## 2020-06-16 DIAGNOSIS — R252 Cramp and spasm: Secondary | ICD-10-CM | POA: Diagnosis not present

## 2020-09-23 ENCOUNTER — Encounter: Payer: Self-pay | Admitting: Gastroenterology

## 2020-09-23 ENCOUNTER — Ambulatory Visit (INDEPENDENT_AMBULATORY_CARE_PROVIDER_SITE_OTHER): Payer: Medicare Other | Admitting: Gastroenterology

## 2020-09-23 VITALS — BP 126/86 | HR 97 | Ht 63.5 in | Wt 148.0 lb

## 2020-09-23 DIAGNOSIS — K581 Irritable bowel syndrome with constipation: Secondary | ICD-10-CM | POA: Diagnosis not present

## 2020-09-23 DIAGNOSIS — R197 Diarrhea, unspecified: Secondary | ICD-10-CM

## 2020-09-23 NOTE — Patient Instructions (Signed)
If you are age 66 or older, your body mass index should be between 23-30. Your Body mass index is 25.81 kg/m. If this is out of the aforementioned range listed, please consider follow up with your Primary Care Provider.  If you are age 76 or younger, your body mass index should be between 19-25. Your Body mass index is 25.81 kg/m. If this is out of the aformentioned range listed, please consider follow up with your Primary Care Provider.   Please have your labs drawn at [facility], you have been given your lab requisitions.   You have [facility] [place] [address] [address]).   Please stop by Radiology on the first floor and schedule your CT before you leave today.   You are scheduled on --- at ----. You should arrive 15 minutes prior to your appointment time for registration. Please follow the written instructions below on the day of your exam:  WARNING: IF YOU ARE ALLERGIC TO IODINE/X-RAY DYE, PLEASE NOTIFY RADIOLOGY IMMEDIATELY AT [phone]! YOU WILL BE GIVEN A 13 HOUR PREMEDICATION PREP.  1) Do not eat or drink anything after --- (4 hours prior to your test) 2) You have been given 2 bottles of oral contrast to drink. The solution may taste better if refrigerated, but do NOT add ice or any other liquid to this solution. Shake well before drinking.    Drink 1 bottle of contrast @ --- (2 hours prior to your exam)  Drink 1 bottle of contrast @ --- (1 hour prior to your exam)  You may take any medications as prescribed with a small amount of water, if necessary. If you take any of the following medications: METFORMIN, GLUCOPHAGE, GLUCOVANCE, AVANDAMET, RIOMET, FORTAMET, Gamaliel MET, JANUMET, GLUMETZA or METAGLIP, you MAY be asked to HOLD this medication 48 hours AFTER the exam.  The purpose of you drinking the oral contrast is to aid in the visualization of your intestinal tract. The  contrast solution may cause some diarrhea. Depending on your individual set of symptoms, you may also receive an intravenous injection of x-ray contrast/dye. Plan on being at [facility] for 30 minutes or longer, depending on the type of exam you are having performed.  This test typically takes 30-45 minutes to complete.  If you have any questions regarding your exam or if you need to reschedule, you may call the CT department at [phone] between the hours of 8:00 am and 5:00 pm, Monday-Friday.  ________________________________________________________________________  Dennis Bast will be contacted by our office prior to your procedure for directions on holding your Plavix.  If you do not hear from our office 1 week prior to your scheduled procedure, please call [phone] to discuss.   It has been recommended to you by your physician that you have a(n) EGD/Colonoscopy completed. Per your request, we did not schedule the procedure(s) today. Please contact our office at [phone] should you decide to have the procedure completed. You will be scheduled for a pre-visit and procedure at that time.  Thank you,  Dr. Jackquline Denmark

## 2020-09-23 NOTE — Progress Notes (Signed)
IMPRESSION and PLAN:    #1. Epi pain  #2. Diarrhea (previous history of constipation)- neg stool studies including WBCs 11/2017, failed viberzi and cholestyramine. Has been on supplements with magnesium, Nl CBC and CMP, neg colonoscopy 09/2015 except for mild sigmoid diverticulosis, normal TI. Neg celiac and TSH   - dicyclomine 10mg  po qid half an hour before meals and at bedtime. - Check CBC, CMP, CRP, HBA1c. - CT AP - EGD/colon  With miralax prep off plavix 5 days. Carry on with asa.      #3. H/O IBS with predominant constipation, history of colonic polyps 01/2009,neg colonoscopy 09/2015 except for mild sigmoid diverticulosis, normal TI.  Next colonoscopy due 09/2025.  #3. CVA on plavix, feb 2021      HPI:    Chief Complaint:   Nicole Hicks is a 65 y.o. female  For follow-up visit  Diarrhea - with soft drinks  3/day, loose. Urgency  Occ nocturnal symtoms  At times she constipated  Lower abdo pain with nausea.   Is about 50% better Unfortunately, fell while in [place] and had left wrist fracture and is currently in a cast. Has been taking pain medicines which has slowed down the diarrhea Negative stool studies, normal CBC, CMP. No nausea, vomiting, heartburn, regurgitation, odynophagia or dysphagia.  No constipation.  There is no melena or hematochezia. No unintentional weight loss. Given trial of Viberzi and cholestyramine without any relief. Bentyl helps with the pain Had negative colonoscopy as above Does get bloated with cheese, not much problems with milk. Has restarted taking omeprazole since she had reflux.    Past Medical History:  Diagnosis Date  . Arthritis    hands and feet  . Bell's palsy    2 episodes  . Bronchospasm   . Diverticulitis   . History of colitis   . History of colon polyps   . IBS (irritable bowel syndrome)   . Insomnia   . Migraines   . Osteoarthritis   . Pernicious anemia   . Stroke [facility]) 11/12/2019    Current  Outpatient Medications  Medication Sig Dispense Refill  . Ascorbic Acid (VITAMIN C PO) Take by mouth daily.    . baclofen (LIORESAL) 10 MG tablet Take 5-10 mg by mouth 2 (two) times daily as needed for muscle spasms.    . Biotin 5000 MCG TABS Take 5,000 mcg by mouth daily.    . Cholecalciferol (VITAMIN D3) 2000 units TABS Take 2,000 mg by mouth daily.    . citalopram (CELEXA) 20 MG tablet Take 40 mg by mouth daily.    Marland Kitchen dicyclomine (BENTYL) 10 MG capsule Take four times daily 1/2 hour before meals and at bedtime. (Patient taking differently: as needed. Take four times daily 1/2 hour before meals and at bedtime.) 120 capsule 4  . dicyclomine (BENTYL) 20 MG tablet Take 20 mg by mouth as needed.     . diphenoxylate-atropine (LOMOTIL) 2.5-0.025 MG tablet Take 1 tablet by mouth 4 (four) times daily as needed for diarrhea or loose stools. (Patient taking differently: Take 1 tablet by mouth as needed for diarrhea or loose stools.) 30 tablet 0  . Erenumab-aooe 70 MG/ML SOAJ Inject 1 pen into the skin every 30 (thirty) days.    Marland Kitchen gabapentin (NEURONTIN) 100 MG capsule Take 100 mg by mouth at bedtime.    Marland Kitchen loratadine (CLARITIN) 10 MG tablet Take 10 mg by mouth daily.    . Metoprolol Succinate 25 MG CS24 Take 25 mg  by mouth daily.    . montelukast (SINGULAIR) 10 MG tablet Take 10 mg by mouth at bedtime.    . Multiple Vitamin (MULTIVITAMIN) tablet Take 1 tablet by mouth daily.    . naproxen (NAPROSYN) 500 MG tablet Take 500 mg by mouth 2 (two) times daily as needed.    Marland Kitchen omeprazole (PRILOSEC) 20 MG capsule Take 20 mg by mouth 2 (two) times daily as needed.    . ondansetron (ZOFRAN) 8 MG tablet Take 8 mg by mouth as needed for nausea or vomiting.     . Pancrelipase, Lip-Prot-Amyl, (ZENPEP) 40000-126000 units CPEP Take 40,000 Units by mouth 3 (three) times daily with meals. 120 capsule 0  . Semaglutide,0.25 or 0.5MG /DOS, (OZEMPIC, 0.25 OR 0.5 MG/DOSE,) 2 MG/1.5ML SOPN Inject into the skin once a week. Saturday     . sitaGLIPtin (JANUVIA) 100 MG tablet Take 100 mg by mouth daily.    . Turmeric Curcumin 500 MG CAPS Take 500 mg by mouth daily.    Marland Kitchen zonisamide (ZONEGRAN) 25 MG capsule Take 25 mg by mouth daily.    Marland Kitchen atenolol (TENORMIN) 25 MG tablet Take 25 mg by mouth at bedtime.    . BELSOMRA 20 MG TABS Take 1 tablet by mouth at bedtime.     No current facility-administered medications for this visit.    Past Surgical History:  Procedure Laterality Date  . ABDOMINAL HYSTERECTOMY  1992  . CHOLECYSTECTOMY  12/2010  . COLONOSCOPY  10/14/2015   Mild sigmoid diverticulosis.     Family History  Problem Relation Age of Onset  . Diabetes Mother   . Heart disease Mother   . Diabetes Father   . Heart disease Father   . Colon cancer Neg Hx     Social History   Tobacco Use  . Smoking status: Never Smoker  . Smokeless tobacco: Never Used  Vaping Use  . Vaping Use: Never used  Substance Use Topics  . Alcohol use: Not Currently  . Drug use: Never    Allergies  Allergen Reactions  . Meloxicam Hives  . Onabotulinumtoxina Swelling    Face swelling, ptosis   . Penicillins Hives and Rash  . Sulfamethoxazole-Trimethoprim Hives  . Cefdinir Rash  . Estrogens Other (See Comments)    Stop estrogens due to right parietal lobe ischemic stroke. Stop estrogens due to right parietal lobe ischemic stroke.   . Telithromycin Rash  . Topiramate Other (See Comments)    Weight loss and metallic taste  . Umeclidinium-Vilanterol Hives  . Ultram [Tramadol Hcl] Hives  . Hydrocodone-Acetaminophen Itching  . Tape Rash    Surgical tape     Review of Systems: All systems reviewed and negative except where noted in HPI.    Physical Exam:     BP 126/86   Pulse 97   Ht 5' 3.5" (1.613 m)   Wt 148 lb (67.1 kg)   BMI 25.81 kg/m    GENERAL:  Alert, oriented, cooperative, not in acute distress. PSYCH: :Pleasant, normal mood and affect. HEENT:  conjunctiva pink, mucous membranes moist, neck supple  without masses. No jaundice. CARDIAC:  S1 S2 normal. No murmers. PULM: Normal respiratory effort, lungs CTA bilaterally, no wheezing. ABDOMEN: Inspection: No visible peristalsis, no abnormal pulsations, skin normal.  Palpation/percussion: Soft, nontender, nondistended, no rigidity, no abnormal dullness to percussion, no hepatosplenomegaly and no palpable abdominal masses.  Auscultation: Normal bowel sounds, no abdominal bruits. Rectal exam: Deferred SKIN:  turgor, no lesions seen. Musculoskeletal:  Normal muscle tone, normal  strength. NEURO: Alert and oriented x 3, no focal neurologic deficits.    Nicole Farone,MD 09/23/2020, 2:28 PM   CC Lise Auer, MD

## 2020-09-29 ENCOUNTER — Ambulatory Visit (HOSPITAL_BASED_OUTPATIENT_CLINIC_OR_DEPARTMENT_OTHER)
Admission: RE | Admit: 2020-09-29 | Discharge: 2020-09-29 | Disposition: A | Discharge status: Home / Self Care | Payer: Medicare Other | Source: Ambulatory Visit | Attending: Gastroenterology | Admitting: Gastroenterology

## 2020-09-29 ENCOUNTER — Encounter (HOSPITAL_BASED_OUTPATIENT_CLINIC_OR_DEPARTMENT_OTHER): Payer: Self-pay

## 2020-09-29 ENCOUNTER — Telehealth: Payer: Self-pay | Admitting: Gastroenterology

## 2020-09-29 ENCOUNTER — Other Ambulatory Visit: Payer: Self-pay

## 2020-09-29 DIAGNOSIS — R197 Diarrhea, unspecified: Secondary | ICD-10-CM

## 2020-09-29 DIAGNOSIS — K581 Irritable bowel syndrome with constipation: Secondary | ICD-10-CM | POA: Insufficient documentation

## 2020-09-29 HISTORY — DX: Essential (primary) hypertension: I10

## 2020-09-29 HISTORY — DX: Type 2 diabetes mellitus without complications: E11.9

## 2020-09-29 HISTORY — DX: Unspecified asthma, uncomplicated: J45.909

## 2020-09-29 MED ORDER — IOHEXOL 300 MG/ML  SOLN
100.0000 mL | Freq: Once | INTRAMUSCULAR | Status: AC | PRN
  Administered 2020-09-29: 100 mL via INTRAVENOUS

## 2020-09-29 NOTE — Telephone Encounter (Signed)
French Ana From [facility] calling for results please call [phone]

## 2020-09-29 NOTE — Telephone Encounter (Signed)
Please review impression #3 on CT and advise

## 2020-09-29 NOTE — Progress Notes (Signed)
Please inform the patient. CT -no acute GI abnormalities.  She does have fatty liver. However, there could be an obstruction of left lower ureter. Plan: -Check UA -Needs urology consultation -She should have had LFTs done as well.  Please make sure that we have a copy Send report to family physician

## 2020-09-29 NOTE — Telephone Encounter (Signed)
Reviewed. Pl see comment on CT RG

## 2020-09-30 ENCOUNTER — Other Ambulatory Visit: Payer: Self-pay | Admitting: Gastroenterology

## 2020-09-30 DIAGNOSIS — R197 Diarrhea, unspecified: Secondary | ICD-10-CM

## 2020-09-30 NOTE — Telephone Encounter (Signed)
Spoke to patient to inform her of recent CT results. She would like to see a Urologist in [place] and reached out to her PCP fopr recommendations. Lab orders faxed to [facility] practice. Results to be faxed to our office. Patient will contact our office with the name of a urologist then referral will be faxed over.

## 2020-09-30 NOTE — Telephone Encounter (Signed)
Inbound call from patient stating she would like referral sent over to Dr. Saddie Benders in [place].

## 2020-09-30 NOTE — Telephone Encounter (Signed)
Referral sent to Dr Saddie Benders

## 2020-12-15 ENCOUNTER — Telehealth: Payer: Self-pay

## 2020-12-15 NOTE — Telephone Encounter (Signed)
Patient declined about setting up a EGD/Colon at this time due to having "kidney stone surgery". I told her to call when she is ready to set up. We did get approval from Dr Welton Flakes on 09/24/2020 that she can hold her aspirin and Plavix.

## 2021-10-19 ENCOUNTER — Ambulatory Visit: Payer: Medicare Other | Admitting: Gastroenterology

## 2021-10-22 ENCOUNTER — Encounter: Payer: Self-pay | Admitting: Gastroenterology

## 2021-10-22 ENCOUNTER — Ambulatory Visit (INDEPENDENT_AMBULATORY_CARE_PROVIDER_SITE_OTHER): Payer: Medicare Other | Admitting: Gastroenterology

## 2021-10-22 ENCOUNTER — Other Ambulatory Visit: Payer: Self-pay

## 2021-10-22 VITALS — BP 134/84 | HR 62 | Ht 63.5 in | Wt 152.4 lb

## 2021-10-22 DIAGNOSIS — R112 Nausea with vomiting, unspecified: Secondary | ICD-10-CM

## 2021-10-22 DIAGNOSIS — R197 Diarrhea, unspecified: Secondary | ICD-10-CM

## 2021-10-22 DIAGNOSIS — R1013 Epigastric pain: Secondary | ICD-10-CM

## 2021-10-22 MED ORDER — HYOSCYAMINE SULFATE ER 0.375 MG PO TB12: 0.3750 mg | ORAL_TABLET | Freq: Two times a day (BID) | ORAL | 6 refills | Status: DC

## 2021-10-22 NOTE — Patient Instructions (Addendum)
If you are age 67 or older, your body mass index should be between 23-30. Your Body mass index is 26.57 kg/m. If this is out of the aforementioned range listed, please consider follow up with your Primary Care Provider.  __________________________________________________________  The [facility] providers would like to encourage you to use [facility] to communicate with providers for non-urgent requests or questions.  Due to long hold times on the telephone, sending your provider a message by [facility] may be a faster and more efficient way to get a response.  Please allow 48 business hours for a response.  Please remember that this is for non-urgent requests.   Due to recent changes in healthcare laws, you may see the results of your imaging and laboratory studies on MyChart before your provider has had a chance to review them.  We understand that in some cases there may be results that are confusing or concerning to you. Not all laboratory results come back in the same time frame and the provider may be waiting for multiple results in order to interpret others.  Please give [place] 48 hours in order for your provider to thoroughly review all the results before contacting the office for clarification of your results.   We have sent the following medications to your pharmacy for you to pick up at your convenience: Phineas Real  You have been scheduled for an endoscopy. Please follow written instructions given to you at your visit today. If you use inhalers (even only as needed), please bring them with you on the day of your procedure.  We will fax order for CT and Gastric emptying scan at [facility] after Prior Authorization is obtained.  It was a pleasure to see you today!  Lynann Bologna, M.D.

## 2021-10-22 NOTE — Progress Notes (Signed)
IMPRESSION and PLAN:    #1. Epi pain. S/P lap chole in past.  #2. Diarrhea (previous history of constipation)- neg stool studies including WBCs 11/2017, failed viberzi and cholestyramine. Has been on supplements with magnesium, Nl CBC and CMP, neg colon 09/2015 except for mild sigmoid diverticulosis, normal TI. Neg celiac and TSH    - Dr Humphrey Rolls - blood tests  - EGD (any M, W, F) - CBC, CMP, lipase at time of EGD - Levbid  0.375 mg 1 tab BID #60, 6 RF - If still with problems, CT AP followed by [facility]   #3. H/O IBS-C, H/O colonic polyps 01/2009, neg colon 09/2015 except for mild sigmoid div, normal TI.  Next colo due 09/2025.  #4. CVA off plavix, feb 2021      HPI:    Chief Complaint:   Nicole Hicks is a 66 y.o. female  For follow-up visit  Epi to lower Abdo pain, at times crampy, associated with nausea, abdominal bloating and occasional heartburn.  Heartburn is better with omeprazole 20 twice daily.  Alternating diarrhea and constipation d/t IBS.  Has been taking dicyclomine with some relief.  Diarrhea - with soft drinks and milk but not cheese  Occ nocturnal symtoms  Neg CT AP January 2022 as below.     CT Abdo/pelvis with contrast 09/2020 1. No acute findings or explanation for diarrhea. 2. Nonspecific heterogeneous hepatic attenuation which may relate to steatosis or regenerating nodules. Correlate with liver function studies. No biliary dilatation post cholecystectomy. 3. Nonobstructive left renal calculi. 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 This could be secondary to a stricture or neoplasm. Urology consultation and correlation with urine analysis and urine cytology recommended. 4. Aortic Atherosclerosis (ICD10-I70.0). 5. These results will be called to the ordering clinician or representative by the Radiologist  Assistant, and communication documented in the PACS or [employer].  Past Medical History:  Diagnosis Date   Arthritis    hands and feet   Asthma    Bell's palsy    2 episodes   Bronchospasm    Diabetes mellitus without complication (HCC)    Diverticulitis    History of colitis    History of colon polyps    Hypertension    IBS (irritable bowel syndrome)    Insomnia    Migraines    Osteoarthritis    Pernicious anemia    Stroke ([facility]) 11/12/2019    Current Outpatient Medications  Medication Sig Dispense Refill   ALPRAZolam (XANAX) 0.5 MG tablet Take 0.5 mg by mouth at bedtime as needed for anxiety.     Ascorbic Acid (VITAMIN C PO) Take by mouth daily.     atenolol (TENORMIN) 50 MG tablet Take 50 mg by mouth daily.     atorvastatin (LIPITOR) 80 MG tablet SMARTSIG:1 Tablet(s) By Mouth Every Evening     baclofen (LIORESAL) 10 MG tablet Take 5-10 mg by mouth 2 (two) times daily as needed for muscle spasms.     Biotin 5000 MCG TABS Take 5,000 mcg by mouth daily.     butalbital-acetaminophen-caffeine (FIORICET) 50-325-40 MG tablet Take 1 tablet by mouth every 8 (eight) hours as needed.     Cholecalciferol (VITAMIN D3) 2000 units TABS Take 2,000 mg by mouth daily.     citalopram (CELEXA) 20 MG tablet Take 40 mg by mouth daily.     dicyclomine (BENTYL) 20  MG tablet Take 20 mg by mouth as needed.      diphenoxylate-atropine (LOMOTIL) 2.5-0.025 MG tablet Take 1 tablet by mouth 4 (four) times daily as needed for diarrhea or loose stools. (Patient taking differently: Take 1 tablet by mouth as needed for diarrhea or loose stools.) 30 tablet 0   Erenumab-aooe 70 MG/ML SOAJ Inject 1 pen into the skin every 30 (thirty) days.     fenofibrate 54 MG tablet Take 54 mg by mouth daily.     gabapentin (NEURONTIN) 100 MG capsule Take 100 mg by mouth at bedtime.     levETIRAcetam (KEPPRA) 500 MG tablet Take 500 mg by mouth as directed.     lisinopril (ZESTRIL) 2.5 MG tablet Take 2.5 mg by mouth at  bedtime.     loratadine (CLARITIN) 10 MG tablet Take 10 mg by mouth daily.     Metoprolol Succinate 25 MG CS24 Take 25 mg by mouth daily.     montelukast (SINGULAIR) 10 MG tablet Take 10 mg by mouth at bedtime.     Multiple Vitamin (MULTIVITAMIN) tablet Take 1 tablet by mouth daily.     naproxen (NAPROSYN) 500 MG tablet Take 500 mg by mouth 2 (two) times daily as needed.     omeprazole (PRILOSEC) 20 MG capsule Take 20 mg by mouth 2 (two) times daily as needed.     ondansetron (ZOFRAN) 8 MG tablet Take 8 mg by mouth as needed for nausea or vomiting.      promethazine (PHENERGAN) 25 MG tablet Take 25 mg by mouth every 8 (eight) hours as needed.     ramelteon (ROZEREM) 8 MG tablet Take 8 mg by mouth as directed.     rOPINIRole (REQUIP) 0.5 MG tablet Take 0.5 mg by mouth as directed.     sitaGLIPtin (JANUVIA) 100 MG tablet Take 100 mg by mouth daily.     No current facility-administered medications for this visit.    Past Surgical History:  Procedure Laterality Date   ABDOMINAL HYSTERECTOMY  1992   CHOLECYSTECTOMY  12/2010   COLONOSCOPY  10/14/2015   Mild sigmoid diverticulosis.     Family History  Problem Relation Age of Onset   Diabetes Mother    Heart disease Mother    Diabetes Father    Heart disease Father    Colon cancer Neg Hx     Social History   Tobacco Use   Smoking status: Never   Smokeless tobacco: Never  Vaping Use   Vaping Use: Never used  Substance Use Topics   Alcohol use: Not Currently   Drug use: Never    Allergies  Allergen Reactions   Meloxicam Hives   Onabotulinumtoxina Swelling    Face swelling, ptosis    Penicillins Hives and Rash   Sulfamethoxazole-Trimethoprim Hives   Cefdinir Rash   Estrogens Other (See Comments)    Stop estrogens due to right parietal lobe ischemic stroke. Stop estrogens due to right parietal lobe ischemic stroke.    Telithromycin Rash   Topiramate Other (See Comments)    Weight loss and metallic taste    Umeclidinium-Vilanterol Hives   Ultram [Tramadol Hcl] Hives   Hydrocodone-Acetaminophen Itching   Tape Rash    Surgical tape     Review of Systems: All systems reviewed and negative except where noted in HPI.    Physical Exam:     BP 134/84    Pulse 62    Ht 5' 3.5" (1.613 m)    Wt 152 lb 6  oz (69.1 kg)    SpO2 97%    BMI 26.57 kg/m    GENERAL:  Alert, oriented, cooperative, not in acute distress. PSYCH: :Pleasant, normal mood and affect. HEENT:  conjunctiva pink, mucous membranes moist, neck supple without masses. No jaundice. CARDIAC:  S1 S2 normal. No murmers. PULM: Normal respiratory effort, lungs CTA bilaterally, no wheezing. ABDOMEN: Inspection: No visible peristalsis, no abnormal pulsations, skin normal.  Palpation/percussion: Soft, nontender, nondistended, no rigidity, no abnormal dullness to percussion, no hepatosplenomegaly and no palpable abdominal masses.  Auscultation: Normal bowel sounds, no abdominal bruits. Rectal exam: Deferred SKIN:  turgor, no lesions seen. Musculoskeletal:  Normal muscle tone, normal strength. NEURO: Alert and oriented x 3, no focal neurologic deficits.    Michae Grimley,MD 10/22/2021, 4:14 PM   CC Mateo Flow, MD

## 2021-10-27 ENCOUNTER — Telehealth: Payer: Self-pay

## 2021-10-27 NOTE — Telephone Encounter (Signed)
Left VM to the patient regarding her imaging. After reviewing notes from 10/22/21 visit and after consulting Dr. Chales Abrahams , CT and gastric emptying scan will be scheduled after EGD procedure if needed.

## 2021-11-01 DIAGNOSIS — J069 Acute upper respiratory infection, unspecified: Secondary | ICD-10-CM | POA: Diagnosis not present

## 2021-11-01 DIAGNOSIS — Z20828 Contact with and (suspected) exposure to other viral communicable diseases: Secondary | ICD-10-CM | POA: Diagnosis not present

## 2021-11-01 NOTE — Telephone Encounter (Signed)
Patient returned the call. Informed her that CT and gastric emptying scan will be scheduled after EGD procedure if needed. Informed her EGD is scheduled on 11/24/21 at 10am, and she has to be at [facility] at [facility]. Patient voiced understanding.

## 2021-11-02 DIAGNOSIS — M79672 Pain in left foot: Secondary | ICD-10-CM | POA: Diagnosis not present

## 2021-11-02 DIAGNOSIS — M25561 Pain in right knee: Secondary | ICD-10-CM | POA: Diagnosis not present

## 2021-11-10 ENCOUNTER — Encounter: Payer: Self-pay | Admitting: Gastroenterology

## 2021-11-24 ENCOUNTER — Encounter: Payer: PPO | Admitting: Gastroenterology

## 2021-11-24 ENCOUNTER — Telehealth: Payer: Self-pay | Admitting: Gastroenterology

## 2021-11-24 NOTE — Telephone Encounter (Signed)
Called pt - No answer-- L/M for pt to call me back regarding her EGD procedure for today. ?

## 2021-11-25 DIAGNOSIS — Z87828 Personal history of other (healed) physical injury and trauma: Secondary | ICD-10-CM | POA: Diagnosis not present

## 2021-11-25 DIAGNOSIS — M1711 Unilateral primary osteoarthritis, right knee: Secondary | ICD-10-CM | POA: Diagnosis not present

## 2021-12-01 DIAGNOSIS — M79672 Pain in left foot: Secondary | ICD-10-CM | POA: Diagnosis not present

## 2021-12-02 DIAGNOSIS — M25561 Pain in right knee: Secondary | ICD-10-CM | POA: Diagnosis not present

## 2021-12-16 DIAGNOSIS — R110 Nausea: Secondary | ICD-10-CM | POA: Diagnosis not present

## 2021-12-16 DIAGNOSIS — G43109 Migraine with aura, not intractable, without status migrainosus: Secondary | ICD-10-CM | POA: Diagnosis not present

## 2021-12-16 DIAGNOSIS — M7918 Myalgia, other site: Secondary | ICD-10-CM | POA: Diagnosis not present

## 2021-12-16 DIAGNOSIS — G4700 Insomnia, unspecified: Secondary | ICD-10-CM | POA: Diagnosis not present

## 2021-12-16 DIAGNOSIS — G629 Polyneuropathy, unspecified: Secondary | ICD-10-CM | POA: Diagnosis not present

## 2021-12-16 DIAGNOSIS — G510 Bell's palsy: Secondary | ICD-10-CM | POA: Diagnosis not present

## 2021-12-16 DIAGNOSIS — I10 Essential (primary) hypertension: Secondary | ICD-10-CM | POA: Diagnosis not present

## 2021-12-16 DIAGNOSIS — G43009 Migraine without aura, not intractable, without status migrainosus: Secondary | ICD-10-CM | POA: Diagnosis not present

## 2021-12-16 DIAGNOSIS — I440 Atrioventricular block, first degree: Secondary | ICD-10-CM | POA: Diagnosis not present

## 2021-12-16 DIAGNOSIS — S8990XA Unspecified injury of unspecified lower leg, initial encounter: Secondary | ICD-10-CM | POA: Diagnosis not present

## 2021-12-16 DIAGNOSIS — E785 Hyperlipidemia, unspecified: Secondary | ICD-10-CM | POA: Diagnosis not present

## 2021-12-16 DIAGNOSIS — Z7984 Long term (current) use of oral hypoglycemic drugs: Secondary | ICD-10-CM | POA: Diagnosis not present

## 2021-12-16 DIAGNOSIS — G4486 Cervicogenic headache: Secondary | ICD-10-CM | POA: Diagnosis not present

## 2021-12-16 DIAGNOSIS — Z79899 Other long term (current) drug therapy: Secondary | ICD-10-CM | POA: Diagnosis not present

## 2021-12-16 DIAGNOSIS — I69322 Dysarthria following cerebral infarction: Secondary | ICD-10-CM | POA: Diagnosis not present

## 2021-12-16 DIAGNOSIS — E119 Type 2 diabetes mellitus without complications: Secondary | ICD-10-CM | POA: Diagnosis not present

## 2021-12-22 DIAGNOSIS — M65861 Other synovitis and tenosynovitis, right lower leg: Secondary | ICD-10-CM | POA: Diagnosis not present

## 2021-12-22 DIAGNOSIS — G8918 Other acute postprocedural pain: Secondary | ICD-10-CM | POA: Diagnosis not present

## 2021-12-22 DIAGNOSIS — M94261 Chondromalacia, right knee: Secondary | ICD-10-CM | POA: Diagnosis not present

## 2021-12-22 DIAGNOSIS — X58XXXA Exposure to other specified factors, initial encounter: Secondary | ICD-10-CM | POA: Diagnosis not present

## 2021-12-22 DIAGNOSIS — M659 Synovitis and tenosynovitis, unspecified: Secondary | ICD-10-CM | POA: Diagnosis not present

## 2021-12-22 DIAGNOSIS — Y999 Unspecified external cause status: Secondary | ICD-10-CM | POA: Diagnosis not present

## 2021-12-22 DIAGNOSIS — M6751 Plica syndrome, right knee: Secondary | ICD-10-CM | POA: Diagnosis not present

## 2021-12-22 DIAGNOSIS — S83231A Complex tear of medial meniscus, current injury, right knee, initial encounter: Secondary | ICD-10-CM | POA: Diagnosis not present

## 2021-12-22 DIAGNOSIS — M2241 Chondromalacia patellae, right knee: Secondary | ICD-10-CM | POA: Diagnosis not present

## 2021-12-29 DIAGNOSIS — R2689 Other abnormalities of gait and mobility: Secondary | ICD-10-CM | POA: Diagnosis not present

## 2021-12-29 DIAGNOSIS — M25461 Effusion, right knee: Secondary | ICD-10-CM | POA: Diagnosis not present

## 2021-12-29 DIAGNOSIS — M62551 Muscle wasting and atrophy, not elsewhere classified, right thigh: Secondary | ICD-10-CM | POA: Diagnosis not present

## 2021-12-29 DIAGNOSIS — M25561 Pain in right knee: Secondary | ICD-10-CM | POA: Diagnosis not present

## 2021-12-30 DIAGNOSIS — M89372 Hypertrophy of bone, left ankle and foot: Secondary | ICD-10-CM | POA: Diagnosis not present

## 2021-12-30 DIAGNOSIS — Z7984 Long term (current) use of oral hypoglycemic drugs: Secondary | ICD-10-CM | POA: Diagnosis not present

## 2021-12-30 DIAGNOSIS — L84 Corns and callosities: Secondary | ICD-10-CM | POA: Diagnosis not present

## 2021-12-30 DIAGNOSIS — E119 Type 2 diabetes mellitus without complications: Secondary | ICD-10-CM | POA: Diagnosis not present

## 2022-01-13 DIAGNOSIS — G43909 Migraine, unspecified, not intractable, without status migrainosus: Secondary | ICD-10-CM | POA: Diagnosis not present

## 2022-02-21 DIAGNOSIS — G43909 Migraine, unspecified, not intractable, without status migrainosus: Secondary | ICD-10-CM | POA: Diagnosis not present

## 2022-03-30 DIAGNOSIS — G43719 Chronic migraine without aura, intractable, without status migrainosus: Secondary | ICD-10-CM | POA: Diagnosis not present

## 2022-03-30 DIAGNOSIS — Z79899 Other long term (current) drug therapy: Secondary | ICD-10-CM | POA: Diagnosis not present

## 2022-03-30 DIAGNOSIS — G4486 Cervicogenic headache: Secondary | ICD-10-CM | POA: Diagnosis not present

## 2022-03-30 DIAGNOSIS — M7918 Myalgia, other site: Secondary | ICD-10-CM | POA: Diagnosis not present

## 2022-03-30 DIAGNOSIS — G43709 Chronic migraine without aura, not intractable, without status migrainosus: Secondary | ICD-10-CM | POA: Diagnosis not present

## 2022-04-13 DIAGNOSIS — E119 Type 2 diabetes mellitus without complications: Secondary | ICD-10-CM | POA: Diagnosis not present

## 2022-04-18 DIAGNOSIS — L84 Corns and callosities: Secondary | ICD-10-CM | POA: Diagnosis not present

## 2022-04-28 DIAGNOSIS — M1711 Unilateral primary osteoarthritis, right knee: Secondary | ICD-10-CM | POA: Diagnosis not present

## 2022-04-28 DIAGNOSIS — Z9889 Other specified postprocedural states: Secondary | ICD-10-CM | POA: Diagnosis not present

## 2022-05-04 DIAGNOSIS — G43909 Migraine, unspecified, not intractable, without status migrainosus: Secondary | ICD-10-CM | POA: Diagnosis not present

## 2022-05-25 DIAGNOSIS — G43909 Migraine, unspecified, not intractable, without status migrainosus: Secondary | ICD-10-CM | POA: Diagnosis not present

## 2022-06-02 DIAGNOSIS — Z7984 Long term (current) use of oral hypoglycemic drugs: Secondary | ICD-10-CM | POA: Diagnosis not present

## 2022-06-02 DIAGNOSIS — M2042 Other hammer toe(s) (acquired), left foot: Secondary | ICD-10-CM | POA: Diagnosis not present

## 2022-06-02 DIAGNOSIS — E119 Type 2 diabetes mellitus without complications: Secondary | ICD-10-CM | POA: Diagnosis not present

## 2022-06-02 DIAGNOSIS — M89372 Hypertrophy of bone, left ankle and foot: Secondary | ICD-10-CM | POA: Diagnosis not present

## 2022-06-02 DIAGNOSIS — L84 Corns and callosities: Secondary | ICD-10-CM | POA: Diagnosis not present

## 2022-06-27 DIAGNOSIS — G43719 Chronic migraine without aura, intractable, without status migrainosus: Secondary | ICD-10-CM | POA: Diagnosis not present

## 2022-06-27 DIAGNOSIS — G510 Bell's palsy: Secondary | ICD-10-CM | POA: Diagnosis not present

## 2022-06-27 DIAGNOSIS — E785 Hyperlipidemia, unspecified: Secondary | ICD-10-CM | POA: Diagnosis not present

## 2022-06-27 DIAGNOSIS — Z79899 Other long term (current) drug therapy: Secondary | ICD-10-CM | POA: Diagnosis not present

## 2022-06-27 DIAGNOSIS — E1142 Type 2 diabetes mellitus with diabetic polyneuropathy: Secondary | ICD-10-CM | POA: Diagnosis not present

## 2022-06-27 DIAGNOSIS — M7918 Myalgia, other site: Secondary | ICD-10-CM | POA: Diagnosis not present

## 2022-06-27 DIAGNOSIS — G4486 Cervicogenic headache: Secondary | ICD-10-CM | POA: Diagnosis not present

## 2022-06-27 DIAGNOSIS — G43709 Chronic migraine without aura, not intractable, without status migrainosus: Secondary | ICD-10-CM | POA: Diagnosis not present

## 2022-06-27 DIAGNOSIS — Z7984 Long term (current) use of oral hypoglycemic drugs: Secondary | ICD-10-CM | POA: Diagnosis not present

## 2022-06-27 DIAGNOSIS — G43109 Migraine with aura, not intractable, without status migrainosus: Secondary | ICD-10-CM | POA: Diagnosis not present

## 2022-06-27 DIAGNOSIS — Z8673 Personal history of transient ischemic attack (TIA), and cerebral infarction without residual deficits: Secondary | ICD-10-CM | POA: Diagnosis not present

## 2022-06-27 DIAGNOSIS — M5481 Occipital neuralgia: Secondary | ICD-10-CM | POA: Diagnosis not present

## 2022-06-27 DIAGNOSIS — M542 Cervicalgia: Secondary | ICD-10-CM | POA: Diagnosis not present

## 2022-06-30 DIAGNOSIS — M2042 Other hammer toe(s) (acquired), left foot: Secondary | ICD-10-CM | POA: Diagnosis not present

## 2022-06-30 DIAGNOSIS — E119 Type 2 diabetes mellitus without complications: Secondary | ICD-10-CM | POA: Diagnosis not present

## 2022-06-30 DIAGNOSIS — M89372 Hypertrophy of bone, left ankle and foot: Secondary | ICD-10-CM | POA: Diagnosis not present

## 2022-07-08 DIAGNOSIS — L84 Corns and callosities: Secondary | ICD-10-CM | POA: Diagnosis not present

## 2022-07-08 DIAGNOSIS — M89372 Hypertrophy of bone, left ankle and foot: Secondary | ICD-10-CM | POA: Diagnosis not present

## 2022-07-08 DIAGNOSIS — M2042 Other hammer toe(s) (acquired), left foot: Secondary | ICD-10-CM | POA: Diagnosis not present

## 2022-07-26 DIAGNOSIS — Z23 Encounter for immunization: Secondary | ICD-10-CM | POA: Diagnosis not present

## 2022-08-02 DIAGNOSIS — G43719 Chronic migraine without aura, intractable, without status migrainosus: Secondary | ICD-10-CM | POA: Diagnosis not present

## 2022-08-04 DIAGNOSIS — G8929 Other chronic pain: Secondary | ICD-10-CM | POA: Diagnosis not present

## 2022-08-04 DIAGNOSIS — M89372 Hypertrophy of bone, left ankle and foot: Secondary | ICD-10-CM | POA: Diagnosis not present

## 2022-08-04 DIAGNOSIS — Z4889 Encounter for other specified surgical aftercare: Secondary | ICD-10-CM | POA: Diagnosis not present

## 2022-08-04 DIAGNOSIS — M25561 Pain in right knee: Secondary | ICD-10-CM | POA: Diagnosis not present

## 2022-09-22 DIAGNOSIS — M25561 Pain in right knee: Secondary | ICD-10-CM | POA: Diagnosis not present

## 2022-09-22 DIAGNOSIS — G8929 Other chronic pain: Secondary | ICD-10-CM | POA: Diagnosis not present

## 2022-09-22 DIAGNOSIS — M13861 Other specified arthritis, right knee: Secondary | ICD-10-CM | POA: Diagnosis not present

## 2022-10-06 DIAGNOSIS — E785 Hyperlipidemia, unspecified: Secondary | ICD-10-CM | POA: Diagnosis not present

## 2022-10-06 DIAGNOSIS — G43909 Migraine, unspecified, not intractable, without status migrainosus: Secondary | ICD-10-CM | POA: Diagnosis not present

## 2022-10-06 DIAGNOSIS — M1990 Unspecified osteoarthritis, unspecified site: Secondary | ICD-10-CM | POA: Diagnosis not present

## 2022-10-06 DIAGNOSIS — K219 Gastro-esophageal reflux disease without esophagitis: Secondary | ICD-10-CM | POA: Diagnosis not present

## 2022-10-06 DIAGNOSIS — E119 Type 2 diabetes mellitus without complications: Secondary | ICD-10-CM | POA: Diagnosis not present

## 2022-10-06 DIAGNOSIS — F419 Anxiety disorder, unspecified: Secondary | ICD-10-CM | POA: Diagnosis not present

## 2022-10-06 DIAGNOSIS — G8929 Other chronic pain: Secondary | ICD-10-CM | POA: Diagnosis not present

## 2022-10-19 DIAGNOSIS — G43719 Chronic migraine without aura, intractable, without status migrainosus: Secondary | ICD-10-CM | POA: Diagnosis not present

## 2022-10-31 DIAGNOSIS — Z79899 Other long term (current) drug therapy: Secondary | ICD-10-CM | POA: Diagnosis not present

## 2022-10-31 DIAGNOSIS — Z0000 Encounter for general adult medical examination without abnormal findings: Secondary | ICD-10-CM | POA: Diagnosis not present

## 2022-10-31 DIAGNOSIS — G4762 Sleep related leg cramps: Secondary | ICD-10-CM | POA: Diagnosis not present

## 2022-10-31 DIAGNOSIS — M5481 Occipital neuralgia: Secondary | ICD-10-CM | POA: Diagnosis not present

## 2022-10-31 DIAGNOSIS — E1165 Type 2 diabetes mellitus with hyperglycemia: Secondary | ICD-10-CM | POA: Diagnosis not present

## 2022-10-31 DIAGNOSIS — E782 Mixed hyperlipidemia: Secondary | ICD-10-CM | POA: Diagnosis not present

## 2022-10-31 DIAGNOSIS — Z6826 Body mass index (BMI) 26.0-26.9, adult: Secondary | ICD-10-CM | POA: Diagnosis not present

## 2022-10-31 DIAGNOSIS — Z1159 Encounter for screening for other viral diseases: Secondary | ICD-10-CM | POA: Diagnosis not present

## 2022-11-02 DIAGNOSIS — G43719 Chronic migraine without aura, intractable, without status migrainosus: Secondary | ICD-10-CM | POA: Diagnosis not present

## 2022-11-17 DIAGNOSIS — M7051 Other bursitis of knee, right knee: Secondary | ICD-10-CM | POA: Diagnosis not present

## 2022-12-24 DIAGNOSIS — R070 Pain in throat: Secondary | ICD-10-CM | POA: Diagnosis not present

## 2022-12-24 DIAGNOSIS — J309 Allergic rhinitis, unspecified: Secondary | ICD-10-CM | POA: Diagnosis not present

## 2022-12-24 DIAGNOSIS — R519 Headache, unspecified: Secondary | ICD-10-CM | POA: Diagnosis not present

## 2022-12-30 DIAGNOSIS — S90221A Contusion of right lesser toe(s) with damage to nail, initial encounter: Secondary | ICD-10-CM | POA: Diagnosis not present

## 2022-12-30 DIAGNOSIS — Z6826 Body mass index (BMI) 26.0-26.9, adult: Secondary | ICD-10-CM | POA: Diagnosis not present

## 2023-01-12 ENCOUNTER — Telehealth: Payer: Self-pay | Admitting: Gastroenterology

## 2023-01-12 DIAGNOSIS — I10 Essential (primary) hypertension: Secondary | ICD-10-CM | POA: Diagnosis not present

## 2023-01-12 DIAGNOSIS — R109 Unspecified abdominal pain: Secondary | ICD-10-CM | POA: Diagnosis not present

## 2023-01-12 DIAGNOSIS — K219 Gastro-esophageal reflux disease without esophagitis: Secondary | ICD-10-CM | POA: Diagnosis not present

## 2023-01-12 DIAGNOSIS — I700 Atherosclerosis of aorta: Secondary | ICD-10-CM | POA: Diagnosis not present

## 2023-01-12 DIAGNOSIS — K589 Irritable bowel syndrome without diarrhea: Secondary | ICD-10-CM | POA: Diagnosis not present

## 2023-01-12 LAB — LAB REPORT - SCANNED

## 2023-01-12 NOTE — Telephone Encounter (Signed)
Patient called in with complaints of "IBS flare up". She is experiencing diarrhea, nausea & sharp generalized abdominal pain (10/10) that started 1 week ago. Currently, she is taking bentyl TID PRN with no relief. She is still able to tolerate liquids/food. Last seen for OV on 10/22/22 & has soonest appointment with Dr. Chales Abrahams on 04/04/23. Patient seeking further recommendations until she can be seen.

## 2023-01-12 NOTE — Telephone Encounter (Signed)
Inbound call from patient, states she wishes to be worked in. Patient is currently scheduled for July with Dr. Chales Abrahams, states she is in severe pain and is requesting a sooner appointment.

## 2023-01-12 NOTE — Telephone Encounter (Signed)
Lets get CBC, CMP, CRP, lipase Please work her into [facility] clinic. Can work her and if there is any cancellation sooner. RG

## 2023-01-12 NOTE — Telephone Encounter (Signed)
Patient states she was seen by a PCP today & was recommended to go to ED. She is currently at [facility] now. Will make MD aware.

## 2023-02-01 DIAGNOSIS — G43719 Chronic migraine without aura, intractable, without status migrainosus: Secondary | ICD-10-CM | POA: Diagnosis not present

## 2023-02-02 ENCOUNTER — Ambulatory Visit: Payer: PPO | Admitting: Vascular Surgery

## 2023-02-02 ENCOUNTER — Encounter: Payer: Self-pay | Admitting: Vascular Surgery

## 2023-02-02 VITALS — BP 125/60 | HR 88 | Temp 97.8°F | Resp 20 | Ht 63.5 in | Wt 147.0 lb

## 2023-02-02 DIAGNOSIS — K551 Chronic vascular disorders of intestine: Secondary | ICD-10-CM | POA: Diagnosis not present

## 2023-02-02 NOTE — Progress Notes (Signed)
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  However in comparing the CT scan that was done on 01/12/2023 with the scan done in January 2022 I do not see it a significant difference.  The stenosis was present at that time.  The celiac axis is widely patent and the inferior mesenteric artery are widely patent.  I do not think her symptoms are consistent with chronic mesenteric ischemia as she does not have postprandial abdominal pain or significant weight loss.  Likewise her symptoms were gradual in onset but she had no acute symptoms to suggest acute mesenteric ischemia.  She does have a history of IBS.  Therefore, at this point I would not recommend an aggressive approach to her superior mesenteric artery stenosis.  I have recommended a follow-up visit in 6 months with a mesenteric duplex at that time.  She knows to call sooner if she has problems.  I have explained that I will be retiring and she will be seeing one of my partners at that time.  She is on aspirin and is on a statin.  REASON FOR CONSULT:    Superior mesenteric arterial stenosis.  The consult is requested by Dr.Sarah Jeanes.    HPI:   Nicole Hicks is a 67 y.o. female who is referred with a superior mesenteric artery stenosis.  This patient noted the gradual onset of abdominal pain in mid April.  The pain would come and go and was not constant.  She denies any postprandial abdominal pain.  She does occasionally get pain after eating peanuts.  She denies any significant weight loss.  On 01/12/23 her symptoms worsen significantly and she was seen at the [facility] emergency department.  The CT scan there showed a significant superior mesenteric artery stenosis.  For this reason she sent for vascular consultation.  Since this emergency department visit her pain has essentially resolved.  She does not describe any postprandial abdominal pain.  She has not had  significant weight loss.  Her risk factors for peripheral arterial disease include type 2 diabetes.  She denies any history of hypertension, hypercholesterolemia, family history of premature cardiovascular disease, or tobacco use.  Past Medical History:  Diagnosis Date   Arthritis    hands and feet   Asthma    Bell's palsy    2 episodes   Bronchospasm    Diabetes mellitus without complication (HCC)    Diverticulitis    History of colitis    History of colon polyps    Hypertension    IBS (irritable bowel syndrome)    Insomnia    Migraines    Osteoarthritis    Pernicious anemia    Stroke (HCC) 11/12/2019    Family History  Problem Relation Age of Onset   Diabetes Mother    Heart disease Mother    Diabetes Father    Heart disease Father    Colon cancer Neg Hx     SOCIAL HISTORY: Social History   Tobacco Use   Smoking status: Never   Smokeless tobacco: Never  Substance Use Topics   Alcohol use: Not Currently    Allergies  Allergen Reactions   Meloxicam Hives   Onabotulinumtoxina Swelling    Face swelling, ptosis    Penicillins Hives and Rash   Sulfamethoxazole-Trimethoprim Hives   Cefdinir Rash   Estrogens Other (See Comments)    Stop estrogens due to right parietal lobe ischemic stroke. Stop estrogens due  to right parietal lobe ischemic stroke.    Telithromycin Rash   Topiramate Other (See Comments)    Weight loss and metallic taste   Umeclidinium-Vilanterol Hives   Ultram [Tramadol Hcl] Hives   Hydrocodone-Acetaminophen Itching   Tape Rash    Surgical tape    Current Outpatient Medications  Medication Sig Dispense Refill   ALPRAZolam (XANAX) 0.5 MG tablet Take 0.5 mg by mouth at bedtime as needed for anxiety.     Ascorbic Acid (VITAMIN C PO) Take by mouth daily.     atenolol (TENORMIN) 50 MG tablet Take 50 mg by mouth daily.     atorvastatin (LIPITOR) 80 MG tablet SMARTSIG:1 Tablet(s) By Mouth Every Evening     baclofen (LIORESAL) 10 MG tablet Take  5-10 mg by mouth 2 (two) times daily as needed for muscle spasms.     Biotin 5000 MCG TABS Take 5,000 mcg by mouth daily.     butalbital-acetaminophen-caffeine (FIORICET) 50-325-40 MG tablet Take 1 tablet by mouth every 8 (eight) hours as needed.     Cholecalciferol (VITAMIN D3) 2000 units TABS Take 2,000 mg by mouth daily.     citalopram (CELEXA) 20 MG tablet Take 40 mg by mouth daily.     dicyclomine (BENTYL) 20 MG tablet Take 20 mg by mouth as needed.      diphenoxylate-atropine (LOMOTIL) 2.5-0.025 MG tablet Take 1 tablet by mouth 4 (four) times daily as needed for diarrhea or loose stools. (Patient taking differently: Take 1 tablet by mouth as needed for diarrhea or loose stools.) 30 tablet 0   Erenumab-aooe 70 MG/ML SOAJ Inject 1 pen into the skin every 30 (thirty) days.     fenofibrate 54 MG tablet Take 54 mg by mouth daily.     gabapentin (NEURONTIN) 100 MG capsule Take 100 mg by mouth at bedtime.     hyoscyamine (LEVBID) 0.375 MG 12 hr tablet Take 1 tablet (0.375 mg total) by mouth 2 (two) times daily. 60 tablet 6   levETIRAcetam (KEPPRA) 500 MG tablet Take 500 mg by mouth as directed.     lisinopril (ZESTRIL) 2.5 MG tablet Take 2.5 mg by mouth at bedtime.     loratadine (CLARITIN) 10 MG tablet Take 10 mg by mouth daily.     Metoprolol Succinate 25 MG CS24 Take 25 mg by mouth daily.     montelukast (SINGULAIR) 10 MG tablet Take 10 mg by mouth at bedtime.     Multiple Vitamin (MULTIVITAMIN) tablet Take 1 tablet by mouth daily.     naproxen (NAPROSYN) 500 MG tablet Take 500 mg by mouth 2 (two) times daily as needed.     omeprazole (PRILOSEC) 20 MG capsule Take 20 mg by mouth 2 (two) times daily as needed.     ondansetron (ZOFRAN) 8 MG tablet Take 8 mg by mouth as needed for nausea or vomiting.      promethazine (PHENERGAN) 25 MG tablet Take 25 mg by mouth every 8 (eight) hours as needed.     ramelteon (ROZEREM) 8 MG tablet Take 8 mg by mouth as directed.     rOPINIRole (REQUIP) 0.5 MG  tablet Take 0.5 mg by mouth as directed.     memantine (NAMENDA) 10 MG tablet Take 10 mg by mouth 2 (two) times daily.     pioglitazone (ACTOS) 30 MG tablet Take 30 mg by mouth daily.     sitaGLIPtin (JANUVIA) 100 MG tablet Take 100 mg by mouth daily. (Patient not taking: Reported on 02/02/2023)  No current facility-administered medications for this visit.    REVIEW OF SYSTEMS:  [X]  denotes positive finding, [ ]  denotes negative finding Cardiac  Comments:  Chest pain or chest pressure:    Shortness of breath upon exertion:    Short of breath when lying flat:    Irregular heart rhythm:        Vascular    Pain in calf, thigh, or hip brought on by ambulation:    Pain in feet at night that wakes you up from your sleep:     Blood clot in your veins:    Leg swelling:         Pulmonary    Oxygen at home:    Productive cough:     Wheezing:         Neurologic    Sudden weakness in arms or legs:     Sudden numbness in arms or legs:     Sudden onset of difficulty speaking or slurred speech:    Temporary loss of vision in one eye:     Problems with dizziness:         Gastrointestinal    Blood in stool:     Vomited blood:         Genitourinary    Burning when urinating:     Blood in urine:        Psychiatric    Major depression:         Hematologic    Bleeding problems:    Problems with blood clotting too easily:        Skin    Rashes or ulcers:        Constitutional    Fever or chills:    -  PHYSICAL EXAM:   Vitals:   02/02/23 1304  BP: 125/60  Pulse: 88  Resp: 20  Temp: 97.8 F (36.6 C)  SpO2: 98%  Weight: 147 lb (66.7 kg)  Height: 5' 3.5" (1.613 m)   Body mass index is 25.63 kg/m. GENERAL: The patient is a well-nourished female, in no acute distress. The vital signs are documented above. CARDIAC: There is a regular rate and rhythm.  VASCULAR: I do not detect carotid bruits. He has palpable femoral, popliteal, and dorsalis pedis pulses  bilaterally. PULMONARY: There is good air exchange bilaterally without wheezing or rales. ABDOMEN: Soft and non-tender with normal pitched bowel sounds.  MUSCULOSKELETAL: There are no major deformities. NEUROLOGIC: No focal weakness or paresthesias are detected. SKIN: There are no ulcers or rashes noted. PSYCHIATRIC: The patient has a normal affect.  DATA:    CT ABDOMEN PELVIS: I have reviewed the images of the CT abdomen pelvis that was done on 01/12/2023.  The celiac axis is widely patent.  There is a filling defect in the proximal superior mesenteric artery with evidence of significant plaque here.  The inferior mesenteric artery is patent.  There are 2 nonobstructive left renal calculi.  The patient has a small hiatal hernia.  Of note, I did compare this scan to her scan that was done in January 2022.  In reviewing these films the stenosis seen on the CT scan on 01/12/2023 looks very similar to that stenosis seen in January 2022.   Waverly Ferrari [facility]

## 2023-02-08 DIAGNOSIS — R400 Somnolence: Secondary | ICD-10-CM | POA: Diagnosis not present

## 2023-02-08 DIAGNOSIS — R0683 Snoring: Secondary | ICD-10-CM | POA: Diagnosis not present

## 2023-02-08 DIAGNOSIS — R519 Headache, unspecified: Secondary | ICD-10-CM | POA: Diagnosis not present

## 2023-02-09 ENCOUNTER — Other Ambulatory Visit: Payer: Self-pay

## 2023-02-09 DIAGNOSIS — K551 Chronic vascular disorders of intestine: Secondary | ICD-10-CM

## 2023-02-23 DIAGNOSIS — Z6826 Body mass index (BMI) 26.0-26.9, adult: Secondary | ICD-10-CM | POA: Diagnosis not present

## 2023-02-23 DIAGNOSIS — J45909 Unspecified asthma, uncomplicated: Secondary | ICD-10-CM | POA: Diagnosis not present

## 2023-04-04 ENCOUNTER — Ambulatory Visit: Payer: PPO | Admitting: Gastroenterology

## 2023-04-04 ENCOUNTER — Telehealth: Payer: Self-pay | Admitting: Gastroenterology

## 2023-04-04 NOTE — Telephone Encounter (Signed)
Pl work her into my clinic [facility]

## 2023-04-04 NOTE — Telephone Encounter (Signed)
Schedule 7-18 at 130 with patient and she was given the address and the floor number as well

## 2023-04-04 NOTE — Telephone Encounter (Signed)
Good Morning Dr. Chales Abrahams,  Patient called stating she went to the [facility] location and would not be able to make her 10:40 appointment with you this morning.  Patient stated she did not want to reschedule.

## 2023-04-06 ENCOUNTER — Ambulatory Visit: Payer: PPO | Admitting: Gastroenterology

## 2023-04-06 ENCOUNTER — Encounter: Payer: Self-pay | Admitting: Gastroenterology

## 2023-04-06 VITALS — BP 124/68 | HR 79 | Ht 63.0 in | Wt 153.4 lb

## 2023-04-06 DIAGNOSIS — R197 Diarrhea, unspecified: Secondary | ICD-10-CM

## 2023-04-06 DIAGNOSIS — K582 Mixed irritable bowel syndrome: Secondary | ICD-10-CM | POA: Diagnosis not present

## 2023-04-06 DIAGNOSIS — R1084 Generalized abdominal pain: Secondary | ICD-10-CM

## 2023-04-06 DIAGNOSIS — Z8719 Personal history of other diseases of the digestive system: Secondary | ICD-10-CM | POA: Diagnosis not present

## 2023-04-06 DIAGNOSIS — R1013 Epigastric pain: Secondary | ICD-10-CM

## 2023-04-06 MED ORDER — HYOSCYAMINE SULFATE ER 0.375 MG PO TB12: 0.3750 mg | ORAL_TABLET | Freq: Two times a day (BID) | ORAL | 3 refills | Status: DC

## 2023-04-06 MED ORDER — PANTOPRAZOLE SODIUM 40 MG PO TBEC: 40.0000 mg | DELAYED_RELEASE_TABLET | Freq: Every day | ORAL | 3 refills | Status: AC

## 2023-04-06 NOTE — Progress Notes (Signed)
IMPRESSION and PLAN:    #1. Gen abdo pain. CT at [facility] showing SMA stenosis. No intervention per vascular as it is unchanged. Rpt Doppler and follow-up appointment with vascular scheduled in coming weeks.  #2. Epi pain with small HH. S/P lap chole in past.  #3. IBS-alternating diarrhea and constipation.  Neg colon 09/2015 except for mild sigmoid diverticulosis, normal TI. Neg celiac, TSH in past   -CBC, CMP, lipase, celiac, CRP, food allegy testing -CT AP with conrast ASAP -EGD  -Change omeprezole to protonix 40mg  po every day -Hold namenda -Levbid 0.375mg  BID.  Can use Bentyl in between.   #3. H/O IBS-C, H/O colonic polyps 01/2009, neg colon 09/2015 except for mild sigmoid div, normal TI.  Next colo due 09/2025.  #4. CVA off plavix, feb 2021      HPI:    Chief Complaint:   Shelley Cocke is a 67 y.o. female  For follow-up visit Pt in tears as she is having significant abdominal pain.  She does not want to go back to ED.  C/O " attacks" of abdominal pain x lasting for days.  Currently having significant bloating, nausea, pain Similar to when she had requiring ED visit to [facility] on 01/12/2023  Records reviewed -Normal CBC, CMP, lipase -CT Abdo/pelvis with contrast showing chronic high-grade stenosis of proximal SMA.  Patent celiac, IMA.  Small HH.  Otherwise normal.  She has been seen by vascular Dr Edilia Bo who has reviewed previous CTs including 1 done in January 2022 which was unchanged.  Doppler study was negative.  Hence, conservative management.  He has follow-up Doppler scheduled in next few weeks followed by follow-up appt  No particular food intolerance  Had nausea without vomiting.  No significant heartburn but has been using omeprazole on as-needed basis.  No nonsteroidals.  Bentyl does help somewhat but not completely.  Has significant anxiety associated with the above.  She has been on alprazolam in the past but has not taken any.  The only new  medication is Namenda for migraines, started about a year ago when all the symptoms started.     Past GI workup:  CT Abdo/pelvis 01/12/2023 -Chronic high-grade proximal SMA stenosis. -Small kidney stones -Small HH -Otherwise normal   CT Abdo/pelvis with contrast 09/2020 1. No acute findings or explanation for diarrhea. 2. Nonspecific heterogeneous hepatic attenuation which may relate to steatosis or regenerating nodules. Correlate with liver function studies. No biliary dilatation post cholecystectomy. 3. Nonobstructive left renal calculi. 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 This could be secondary to a stricture or neoplasm. Urology consultation and correlation with urine analysis and urine cytology recommended. 4. Aortic Atherosclerosis (ICD10-I70.0). 5. These results will be called to the ordering clinician or representative by the Radiologist Assistant, and communication documented in the PACS or [employer].  Past Medical History:  Diagnosis Date   Arthritis    hands and feet   Asthma    Bell's palsy    2 episodes   Bronchospasm    Diabetes mellitus without complication (HCC)    Diverticulitis    History of colitis    History of colon polyps    Hypertension    IBS (irritable bowel syndrome)    Insomnia    Migraines    Osteoarthritis    Pernicious anemia    Stroke (HCC) 11/12/2019    Current Outpatient Medications  Medication Sig Dispense Refill   ALPRAZolam (XANAX) 0.5 MG tablet Take 0.5 mg by mouth at bedtime as needed for anxiety.     Ascorbic Acid (VITAMIN C PO) Take by mouth daily.     atenolol (TENORMIN) 50 MG tablet Take 50 mg by mouth daily.     atorvastatin (LIPITOR) 80 MG tablet SMARTSIG:1 Tablet(s) By Mouth Every Evening     baclofen (LIORESAL) 10 MG tablet Take 5-10 mg by mouth 2 (two) times daily  as needed for muscle spasms.     Biotin 5000 MCG TABS Take 5,000 mcg by mouth daily.     butalbital-acetaminophen-caffeine (FIORICET) 50-325-40 MG tablet Take 1 tablet by mouth every 8 (eight) hours as needed.     Cholecalciferol (VITAMIN D3) 2000 units TABS Take 2,000 mg by mouth daily.     citalopram (CELEXA) 20 MG tablet Take 40 mg by mouth daily.     dicyclomine (BENTYL) 20 MG tablet Take 20 mg by mouth as needed.      diphenoxylate-atropine (LOMOTIL) 2.5-0.025 MG tablet Take 1 tablet by mouth 4 (four) times daily as needed for diarrhea or loose stools. (Patient taking differently: Take 1 tablet by mouth as needed for diarrhea or loose stools.) 30 tablet 0   Erenumab-aooe 70 MG/ML SOAJ Inject 1 pen into the skin every 30 (thirty) days.     fenofibrate 54 MG tablet Take 54 mg by mouth daily.     gabapentin (NEURONTIN) 100 MG capsule Take 100 mg by mouth at bedtime.     glimepiride (AMARYL) 2 MG tablet Take 2 mg by mouth daily with breakfast.     levETIRAcetam (KEPPRA) 500 MG tablet Take 500 mg by mouth as directed.     lisinopril (ZESTRIL) 2.5 MG tablet Take 2.5 mg by mouth at bedtime.     loratadine (CLARITIN) 10 MG tablet Take 10 mg by mouth daily.     memantine (NAMENDA) 10 MG tablet Take 10 mg by mouth 2 (two) times daily.     Metoprolol Succinate 25 MG CS24 Take 25 mg by mouth daily.     montelukast (SINGULAIR) 10 MG tablet Take 10 mg by mouth at bedtime.     Multiple Vitamin (MULTIVITAMIN) tablet Take 1 tablet by mouth daily.     naproxen (NAPROSYN) 500 MG tablet Take 500 mg by mouth 2 (two) times daily as needed.     omeprazole (PRILOSEC) 20 MG capsule Take 20 mg by mouth 2 (two) times daily as needed.     ondansetron (ZOFRAN) 8 MG tablet Take 8 mg by mouth as needed for nausea or vomiting.      pioglitazone (ACTOS) 30 MG tablet Take 30 mg by mouth daily.     promethazine (PHENERGAN) 25 MG tablet Take 25 mg by mouth every 8 (eight) hours as needed.     ramelteon (ROZEREM) 8 MG tablet  Take 8 mg by mouth as directed.     rOPINIRole (REQUIP) 0.5 MG tablet Take 0.5 mg by mouth as directed.     sitaGLIPtin (JANUVIA) 100 MG tablet Take 100 mg by mouth daily.     No current facility-administered medications for this visit.    Past Surgical History:  Procedure Laterality Date   ABDOMINAL HYSTERECTOMY  1992   BUNIONECTOMY     CHOLECYSTECTOMY  12/2010   COLONOSCOPY  10/14/2015   Mild sigmoid diverticulosis.    KNEE SURGERY      Family History  Problem Relation Age of Onset   Diabetes Mother  Heart disease Mother    Diabetes Father    Heart disease Father    Colon cancer Neg Hx     Social History   Tobacco Use   Smoking status: Never   Smokeless tobacco: Never  Vaping Use   Vaping status: Never Used  Substance Use Topics   Alcohol use: Not Currently   Drug use: Never    Allergies  Allergen Reactions   Meloxicam Hives   Onabotulinumtoxina Swelling    Face swelling, ptosis    Penicillins Hives and Rash   Sulfamethoxazole-Trimethoprim Hives   Cefdinir Rash   Estrogens Other (See Comments)    Stop estrogens due to right parietal lobe ischemic stroke. Stop estrogens due to right parietal lobe ischemic stroke.    Telithromycin Rash   Topiramate Other (See Comments)    Weight loss and metallic taste   Umeclidinium-Vilanterol Hives   Ultram [Tramadol Hcl] Hives   Hydrocodone-Acetaminophen Itching   Tape Rash    Surgical tape     Review of Systems: All systems reviewed and negative except where noted in HPI.    Physical Exam:     BP 124/68   Pulse 79   Ht 5\' 3"  (1.6 m)   Wt 153 lb 6.4 oz (69.6 kg)   SpO2 95%   BMI 27.17 kg/m    GENERAL:  Alert, oriented, cooperative, not in acute distress.  Currently in tears. PSYCH: :Pleasant, normal mood and affect. HEENT:  conjunctiva pink, mucous membranes moist, neck supple without masses. No jaundice. CARDIAC:  S1 S2 normal. No murmers. PULM: Normal respiratory effort, lungs CTA bilaterally, no  wheezing. ABDOMEN: Inspection: No visible peristalsis, no abnormal pulsations, skin normal.  Palpation/percussion: Soft,abdominal mild distention without rebound., nondistended, no rigidity, no abnormal dullness to percussion, no hepatosplenomegaly and no palpable abdominal masses.  Auscultation: Normal bowel sounds, no abdominal bruits. Rectal exam: Deferred SKIN:  turgor, no lesions seen. Musculoskeletal:  Normal muscle tone, normal strength. NEURO: Alert and oriented x 3, no focal neurologic deficits.    Courtnei Ruddell,MD 04/06/2023, 1:58 PM   CC Lise Auer, MD

## 2023-04-06 NOTE — Patient Instructions (Addendum)
_______________________________________________________  If your blood pressure at your visit was 140/90 or greater, please contact your primary care physician to follow up on this.  _______________________________________________________  If you are age 68 or older, your body mass index should be between 23-30. Your Body mass index is 27.17 kg/m. If this is out of the aforementioned range listed, please consider follow up with your Primary Care Provider.  If you are age 43 or younger, your body mass index should be between 19-25. Your Body mass index is 27.17 kg/m. If this is out of the aformentioned range listed, please consider follow up with your Primary Care Provider.   ________________________________________________________  The [facility] providers would like to encourage you to use [facility] to communicate with providers for non-urgent requests or questions.  Due to long hold times on the telephone, sending your provider a message by [facility] may be a faster and more efficient way to get a response.  Please allow 48 business hours for a response.  Please remember that this is for non-urgent requests.  _______________________________________________________  Keep mesenteric  appointment in November  Your provider has requested that you go to the basement level for lab work before leaving today. Press "B" on the elevator. The lab is located at the first door on the left as you exit the elevator.  We have sent the following medications to your pharmacy for you to pick up at your convenience: Protonix 40mg  daily  Stop omeprazole  Hold namenda Levbid 2 times a day  You have been scheduled for an endoscopy. Please follow written instructions given to you at your visit today.  If you use inhalers (even only as needed), please bring them with you on the day of your procedure.  If you take any of the following medications, they will need to be adjusted prior to your procedure:   DO NOT  TAKE 7 DAYS PRIOR TO TEST- Trulicity (dulaglutide) Ozempic, Wegovy (semaglutide) Mounjaro (tirzepatide) Bydureon Bcise (exanatide extended release)  DO NOT TAKE 1 DAY PRIOR TO YOUR TEST Rybelsus (semaglutide) Adlyxin (lixisenatide) Victoza (liraglutide) Byetta (exanatide) ___________________________________________________________________________   You have been scheduled for a CT scan of the abdomen and pelvis at [facility] [place], [place], [place] [address]).   You are scheduled on 04-18-2023 at 430pm. You should arrive by 215pm your appointment time for registration. Please follow the written instructions below on the day of your exam:  WARNING: IF YOU ARE ALLERGIC TO IODINE/X-RAY DYE, PLEASE NOTIFY RADIOLOGY IMMEDIATELY AT [phone]! YOU WILL BE GIVEN A 13 HOUR PREMEDICATION PREP.  1) Do not eat or drink anything after 1230pm (4 hours prior to your test) 2) You have been given 2 bottles of oral contrast to drink. The solution may taste better if refrigerated, but do NOT add ice or any other liquid to this solution. Shake well before drinking.    Drink 1 bottle of contrast @ 230pm (2 hours prior to your exam)  Drink 1 bottle of contrast @ 330pm (1 hour prior to your exam)  You may take any medications as prescribed with a small amount of water, if necessary. If you take any of the following medications: METFORMIN, GLUCOPHAGE, GLUCOVANCE, AVANDAMET, RIOMET, FORTAMET, ACTOPLUS MET, JANUMET, GLUMETZA or METAGLIP, you MAY be asked to HOLD this medication 48 hours AFTER the exam.  The purpose of you drinking the oral contrast is to aid in the visualization of your intestinal tract. The contrast solution may cause some diarrhea. Depending on your individual  set of symptoms, you may also receive an intravenous injection of x-ray contrast/dye. Plan on being at [facility] for 30 minutes or longer, depending on the type of exam you are having performed.  This test  typically takes 30-45 minutes to complete.  If you have any questions regarding your exam or if you need to reschedule, you may call the CT department at [phone] between the hours of 8:00 am and 5:00 pm, Monday-Friday.  ________________________________________________________________________  Thank you,  Dr. Lynann Bologna

## 2023-04-18 ENCOUNTER — Ambulatory Visit (HOSPITAL_COMMUNITY)
Admission: RE | Admit: 2023-04-18 | Discharge: 2023-04-18 | Disposition: A | Discharge status: Home / Self Care | Payer: PPO | Source: Ambulatory Visit | Attending: Gastroenterology | Admitting: Gastroenterology

## 2023-04-18 DIAGNOSIS — R197 Diarrhea, unspecified: Secondary | ICD-10-CM | POA: Diagnosis not present

## 2023-04-18 DIAGNOSIS — Z8719 Personal history of other diseases of the digestive system: Secondary | ICD-10-CM | POA: Diagnosis not present

## 2023-04-18 DIAGNOSIS — K589 Irritable bowel syndrome without diarrhea: Secondary | ICD-10-CM | POA: Diagnosis not present

## 2023-04-18 DIAGNOSIS — R1013 Epigastric pain: Secondary | ICD-10-CM | POA: Insufficient documentation

## 2023-04-18 DIAGNOSIS — R1084 Generalized abdominal pain: Secondary | ICD-10-CM | POA: Diagnosis not present

## 2023-04-18 DIAGNOSIS — N200 Calculus of kidney: Secondary | ICD-10-CM | POA: Diagnosis not present

## 2023-04-18 DIAGNOSIS — K449 Diaphragmatic hernia without obstruction or gangrene: Secondary | ICD-10-CM | POA: Diagnosis not present

## 2023-04-18 DIAGNOSIS — Q630 Accessory kidney: Secondary | ICD-10-CM | POA: Diagnosis not present

## 2023-04-18 LAB — POCT I-STAT CREATININE: Creatinine, Ser: 0.7 mg/dL (ref 0.44–1.00)

## 2023-04-18 MED ORDER — IOHEXOL 300 MG/ML  SOLN
100.0000 mL | Freq: Once | INTRAMUSCULAR | Status: AC | PRN
  Administered 2023-04-18: 100 mL via INTRAVENOUS

## 2023-04-19 ENCOUNTER — Encounter: Payer: PPO | Admitting: Gastroenterology

## 2023-04-19 ENCOUNTER — Encounter: Payer: Self-pay | Admitting: Gastroenterology

## 2023-04-19 ENCOUNTER — Ambulatory Visit (AMBULATORY_SURGERY_CENTER): Payer: PPO | Admitting: Gastroenterology

## 2023-04-19 VITALS — BP 160/89 | HR 68 | Temp 98.0°F | Resp 15 | Ht 63.0 in | Wt 153.0 lb

## 2023-04-19 DIAGNOSIS — K317 Polyp of stomach and duodenum: Secondary | ICD-10-CM | POA: Diagnosis not present

## 2023-04-19 DIAGNOSIS — E119 Type 2 diabetes mellitus without complications: Secondary | ICD-10-CM | POA: Diagnosis not present

## 2023-04-19 DIAGNOSIS — R1013 Epigastric pain: Secondary | ICD-10-CM | POA: Diagnosis not present

## 2023-04-19 DIAGNOSIS — K259 Gastric ulcer, unspecified as acute or chronic, without hemorrhage or perforation: Secondary | ICD-10-CM

## 2023-04-19 DIAGNOSIS — I10 Essential (primary) hypertension: Secondary | ICD-10-CM | POA: Diagnosis not present

## 2023-04-19 DIAGNOSIS — E785 Hyperlipidemia, unspecified: Secondary | ICD-10-CM | POA: Diagnosis not present

## 2023-04-19 MED ORDER — SODIUM CHLORIDE 0.9 % IV SOLN: 500.0000 mL | Freq: Once | INTRAVENOUS | Status: DC

## 2023-04-19 NOTE — Op Note (Signed)
[facility] Patient Name: Nicole Hicks Procedure Date: 04/19/2023 9:38 AM MRN: [id number] Endoscopist: Lynann Bologna , MD, [id number] Age: 67 Referring MD:  Date of Birth: [birth year] Gender: Female Account #: [id number] Procedure:                Upper GI endoscopy Indications:              Epigastric abdominal pain Medicines:                Monitored Anesthesia Care Procedure:                Pre-Anesthesia Assessment:                           - Prior to the procedure, a History and Physical                            was performed, and patient medications and                            allergies were reviewed. The patient's tolerance of                            previous anesthesia was also reviewed. The risks                            and benefits of the procedure and the sedation                            options and risks were discussed with the patient.                            All questions were answered, and informed consent                            was obtained. Prior Anticoagulants: The patient has                            taken no anticoagulant or antiplatelet agents. ASA                            Grade Assessment: III - A patient with severe                            systemic disease. After reviewing the risks and                            benefits, the patient was deemed in satisfactory                            condition to undergo the procedure.                           After obtaining informed consent, the endoscope was  passed under direct vision. Throughout the                            procedure, the patient's blood pressure, pulse, and                            oxygen saturations were monitored continuously. The                            Olympus Scope [id number] was introduced through the                            mouth, and advanced to the second part of duodenum.                            The upper GI endoscopy  was accomplished without                            difficulty. The patient tolerated the procedure                            well. Scope In: Scope Out: Findings:                 The lower third of the esophagus was moderately                            tortuous and foreshortened due to hiatal hernia.                           A 5 cm hiatal hernia was present extending from 35                            up to 40 cm (GE junction to diaphragmatic hiatus).                           One non-bleeding cratered gastric ulcer with no                            stigmata of bleeding was found in the gastric                            antrum, next to pylorus without outlet obstruction.                            The lesion was 8 mm in largest dimension. Biopsies                            were taken with a cold forceps for histology.                           A few 4 to 6 mm sessile polyps with no bleeding and  no stigmata of recent bleeding were found in the                            gastric body. Three polyps were removed with a cold                            snare. Resection and retrieval were complete.                           The examined duodenum was normal. Biopsies for                            histology were taken with a cold forceps for                            evaluation of celiac disease. Complications:            No immediate complications. Estimated Blood Loss:     Estimated blood loss: none. Impression:               - 5 cm hiatal hernia.                           - Non-bleeding gastric ulcer with no stigmata of                            bleeding. Biopsied.                           - A few gastric polyps. (Resected x 3). Recommendation:           - Patient has a contact number available for                            emergencies. The signs and symptoms of potential                            delayed complications were discussed with the                             patient. Return to normal activities tomorrow.                            Written discharge instructions were provided to the                            patient.                           - Resume previous diet.                           - Increase Protonix 40 mg p.o. twice daily x 4                            weeks, then once a day  to continue                           - Await pathology results.                           - Await results of CT performed yesterday                           - No aspirin, ibuprofen, naproxen, or other                            non-steroidal anti-inflammatory drugs.                           - Repeat EGD in appox 12 weeks to ensure healing.                           - The findings and recommendations were discussed                            with the designated responsible adult. Lynann Bologna, MD 04/19/2023 10:06:09 AM This report has been signed electronically.

## 2023-04-19 NOTE — Progress Notes (Signed)
Repeat EGD scheduled. Instructions printed and reviewed with pt and care partner.

## 2023-04-19 NOTE — Patient Instructions (Addendum)
Handout on GERD/Hiatal Hernia to patient  Await pathology results from biopsies taken today No aspirin, ibuprofen, naproxen, or any other non-steroidal anti-inflammatory medications Increase Protonix 40 mg twice a day for 4 weeks then back to once a day  Resume previous diet and continue present medications Repeat upper endoscopy in 12 weeks to check for healing of gastric ulcer - scheduled for Wednesday 07/12/23 at 9 am be here by 8 am (instructions included) Await CT scan results from yesterday     YOU HAD AN ENDOSCOPIC PROCEDURE TODAY AT THE [facility]:   Refer to the procedure report that was given to you for any specific questions about what was found during the examination.  If the procedure report does not answer your questions, please call your gastroenterologist to clarify.  If you requested that your care partner not be given the details of your procedure findings, then the procedure report has been included in a sealed envelope for you to review at your convenience later.  YOU SHOULD EXPECT: Some feelings of bloating in the abdomen. Passage of more gas than usual.  Walking can help get rid of the air that was put into your GI tract during the procedure and reduce the bloating. If you had a lower endoscopy (such as a colonoscopy or flexible sigmoidoscopy) you may notice spotting of blood in your stool or on the toilet paper. If you underwent a bowel prep for your procedure, you may not have a normal bowel movement for a few days.  Please Note:  You might notice some irritation and congestion in your nose or some drainage.  This is from the oxygen used during your procedure.  There is no need for concern and it should clear up in a day or so.  SYMPTOMS TO REPORT IMMEDIATELY:  Following upper endoscopy (EGD)  Vomiting of blood or coffee ground material  New chest pain or pain under the shoulder blades  Painful or persistently difficult swallowing  New shortness of  breath  Fever of 100F or higher  Black, tarry-looking stools  For urgent or emergent issues, a gastroenterologist can be reached at any hour by calling [phone]. Do not use MyChart messaging for urgent concerns.    DIET:  We do recommend a small meal at first, but then you may proceed to your regular diet.  Drink plenty of fluids but you should avoid alcoholic beverages for 24 hours.  ACTIVITY:  You should plan to take it easy for the rest of today and you should NOT DRIVE or use heavy machinery until tomorrow (because of the sedation medicines used during the test).    FOLLOW UP: Our staff will call the number listed on your records the next business day following your procedure.  We will call around 7:15- 8:00 am to check on you and address any questions or concerns that you may have regarding the information given to you following your procedure. If we do not reach you, we will leave a message.     If any biopsies were taken you will be contacted by phone or by letter within the next 1-3 weeks.  Please call us at [phone] if you have not heard about the biopsies in 3 weeks.    SIGNATURES/CONFIDENTIALITY: You and/or your care partner have signed paperwork which will be entered into your electronic medical record.  These signatures attest to the fact that that the information above on your After Visit Summary has been reviewed and is understood.  Full responsibility of the confidentiality of this discharge information lies with you and/or your care-partner.

## 2023-04-19 NOTE — Progress Notes (Signed)
Vs nad trans to pacu

## 2023-04-19 NOTE — Progress Notes (Signed)
IMPRESSION and PLAN:    #1. Gen abdo pain. 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 No intervention per vascular as it is unchanged. Rpt Doppler and follow-up appointment with vascular scheduled in coming weeks.  #2. Epi pain with small HH. S/P lap chole in past.  #3. IBS-alternating diarrhea and constipation.  Neg colon 09/2015 except for mild sigmoid diverticulosis, normal TI. Neg celiac, TSH in past   -CBC, CMP, lipase, celiac, CRP, food allegy testing -CT AP with conrast ASAP -EGD  -Change omeprezole to protonix 40mg  po every day -Hold namenda -Levbid 0.375mg  BID.  Can use Bentyl in between.   #3. H/O IBS-C, H/O colonic polyps 01/2009, neg colon 09/2015 except for mild sigmoid div, normal TI.  Next colo due 09/2025.  #4. CVA off plavix, feb 2021      HPI:    Chief Complaint:   Nicole Hicks is a 67 y.o. female  For follow-up visit Pt in tears as she is having significant abdominal pain.  She does not want to go back to ED.  C/O " attacks" of abdominal pain x lasting for days.  Currently having significant bloating, nausea, pain Similar to when she had requiring ED visit to [facility] on 01/12/2023  Records reviewed -Normal CBC, CMP, lipase -CT Abdo/pelvis with contrast showing chronic high-grade stenosis of proximal SMA.  Patent celiac, IMA.  Small HH.  Otherwise normal.  She has been seen by vascular Dr Edilia Bo who has reviewed previous CTs including 1 done in January 2022 which was unchanged.  Doppler study was negative.  Hence, conservative management.  He has follow-up Doppler scheduled in next few weeks followed by follow-up appt  No particular food intolerance  Had nausea without vomiting.  No significant heartburn but has been using omeprazole on as-needed basis.  No nonsteroidals.  Bentyl does help somewhat but not completely.  Has significant anxiety associated with the above.  She has been on alprazolam in the past but has not taken any.  The only new  medication is Namenda for migraines, started about a year ago when all the symptoms started.     Past GI workup:  CT Abdo/pelvis 01/12/2023 -Chronic high-grade proximal SMA stenosis. -Small kidney stones -Small HH -Otherwise normal   CT Abdo/pelvis with contrast 09/2020 1. No acute findings or explanation for diarrhea. 2. Nonspecific heterogeneous hepatic attenuation which may relate to steatosis or regenerating nodules. Correlate with liver function studies. No biliary dilatation post cholecystectomy. 3. Nonobstructive left renal calculi. 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 This could be secondary to a stricture or neoplasm. Urology consultation and correlation with urine analysis and urine cytology recommended. 4. Aortic Atherosclerosis (ICD10-I70.0). 5. These results will be called to the ordering clinician or representative by the Radiologist Assistant, and communication documented in the PACS or [employer].  Past Medical History:  Diagnosis Date   Allergy    Arthritis    hands and feet   Asthma    Bell's palsy    2 episodes   Bronchospasm    Diabetes mellitus without complication (HCC)    Diverticulitis    GERD (gastroesophageal reflux disease)    History of colitis    History of colon polyps    Hyperlipidemia    Hypertension    IBS (irritable bowel syndrome)    Insomnia    Migraines    Osteoarthritis  Pernicious anemia    Stroke (HCC) 11/12/2019    Current Outpatient Medications  Medication Sig Dispense Refill   Ascorbic Acid (VITAMIN C PO) Take by mouth daily.     atenolol (TENORMIN) 50 MG tablet Take 50 mg by mouth daily.     atorvastatin (LIPITOR) 80 MG tablet SMARTSIG:1 Tablet(s) By Mouth Every Evening     baclofen (LIORESAL) 10 MG tablet Take 5-10 mg by mouth 2 (two) times daily as needed for  muscle spasms.     Biotin 5000 MCG TABS Take 5,000 mcg by mouth daily.     Cholecalciferol (VITAMIN D3) 2000 units TABS Take 2,000 mg by mouth daily.     citalopram (CELEXA) 20 MG tablet Take 40 mg by mouth daily.     dicyclomine (BENTYL) 20 MG tablet Take 20 mg by mouth as needed.      fenofibrate 54 MG tablet Take 54 mg by mouth daily.     gabapentin (NEURONTIN) 100 MG capsule Take 100 mg by mouth at bedtime.     glimepiride (AMARYL) 2 MG tablet Take 2 mg by mouth daily with breakfast.     hyoscyamine (LEVBID) 0.375 MG 12 hr tablet Take 1 tablet (0.375 mg total) by mouth 2 (two) times daily. 180 tablet 3   levETIRAcetam (KEPPRA) 500 MG tablet Take 500 mg by mouth as directed.     lisinopril (ZESTRIL) 2.5 MG tablet Take 2.5 mg by mouth at bedtime.     loratadine (CLARITIN) 10 MG tablet Take 10 mg by mouth daily.     Metoprolol Succinate 25 MG CS24 Take 25 mg by mouth daily.     Multiple Vitamin (MULTIVITAMIN) tablet Take 1 tablet by mouth daily.     omeprazole (PRILOSEC) 20 MG capsule Take 20 mg by mouth 2 (two) times daily as needed.     ondansetron (ZOFRAN) 8 MG tablet Take 8 mg by mouth as needed for nausea or vomiting.      pantoprazole (PROTONIX) 40 MG tablet Take 1 tablet (40 mg total) by mouth daily. 90 tablet 3   pioglitazone (ACTOS) 30 MG tablet Take 30 mg by mouth daily.     promethazine (PHENERGAN) 25 MG tablet Take 25 mg by mouth every 8 (eight) hours as needed.     ramelteon (ROZEREM) 8 MG tablet Take 8 mg by mouth as directed.     sitaGLIPtin (JANUVIA) 100 MG tablet Take 100 mg by mouth daily.     ALPRAZolam (XANAX) 0.5 MG tablet Take 0.5 mg by mouth at bedtime as needed for anxiety.     butalbital-acetaminophen-caffeine (FIORICET) 50-325-40 MG tablet Take 1 tablet by mouth every 8 (eight) hours as needed.     diphenoxylate-atropine (LOMOTIL) 2.5-0.025 MG tablet Take 1 tablet by mouth 4 (four) times daily as needed for diarrhea or loose stools. (Patient taking differently: Take  1 tablet by mouth as needed for diarrhea or loose stools.) 30 tablet 0   Erenumab-aooe 70 MG/ML SOAJ Inject 1 pen into the skin every 30 (thirty) days.     memantine (NAMENDA) 10 MG tablet Take 10 mg by mouth 2 (two) times daily.     montelukast (SINGULAIR) 10 MG tablet Take 10 mg by mouth at bedtime. (Patient not taking: Reported on 04/19/2023)     naproxen (NAPROSYN) 500 MG tablet Take 500 mg by mouth 2 (two) times daily as needed.     rOPINIRole (REQUIP) 0.5 MG tablet Take 0.5 mg by mouth as directed. (Patient not taking: Reported on  04/19/2023)     Current Facility-Administered Medications  Medication Dose Route Frequency Provider Last Rate Last Admin   0.9 %  sodium chloride infusion  500 mL Intravenous Once Lynann Bologna, MD        Past Surgical History:  Procedure Laterality Date   ABDOMINAL HYSTERECTOMY  1992   BUNIONECTOMY     CHOLECYSTECTOMY  12/2010   COLONOSCOPY  10/14/2015   Mild sigmoid diverticulosis.    KNEE SURGERY      Family History  Problem Relation Age of Onset   Diabetes Mother    Heart disease Mother    Diabetes Father    Heart disease Father    Colon cancer Neg Hx     Social History   Tobacco Use   Smoking status: Never   Smokeless tobacco: Never  Vaping Use   Vaping status: Never Used  Substance Use Topics   Alcohol use: Not Currently   Drug use: Never    Allergies  Allergen Reactions   Meloxicam Hives   Onabotulinumtoxina Swelling    Face swelling, ptosis    Penicillins Hives and Rash   Sulfamethoxazole-Trimethoprim Hives   Cefdinir Rash   Estrogens Other (See Comments)    Stop estrogens due to right parietal lobe ischemic stroke. Stop estrogens due to right parietal lobe ischemic stroke.    Telithromycin Rash   Topiramate Other (See Comments)    Weight loss and metallic taste   Umeclidinium-Vilanterol Hives   Ultram [Tramadol Hcl] Hives   Hydrocodone-Acetaminophen Itching   Tape Rash    Surgical tape     Review of  Systems: All systems reviewed and negative except where noted in HPI.    Physical Exam:     BP (!) 141/82   Pulse 73   Temp 98 F (36.7 C)   Resp 15   Ht 5\' 3"  (1.6 m)   Wt 153 lb (69.4 kg)   SpO2 95%   BMI 27.10 kg/m    GENERAL:  Alert, oriented, cooperative, not in acute distress.  Currently in tears. PSYCH: :Pleasant, normal mood and affect. HEENT:  conjunctiva pink, mucous membranes moist, neck supple without masses. No jaundice. CARDIAC:  S1 S2 normal. No murmers. PULM: Normal respiratory effort, lungs CTA bilaterally, no wheezing. ABDOMEN: Inspection: No visible peristalsis, no abnormal pulsations, skin normal.  Palpation/percussion: Soft,abdominal mild distention without rebound., nondistended, no rigidity, no abnormal dullness to percussion, no hepatosplenomegaly and no palpable abdominal masses.  Auscultation: Normal bowel sounds, no abdominal bruits. Rectal exam: Deferred SKIN:  turgor, no lesions seen. Musculoskeletal:  Normal muscle tone, normal strength. NEURO: Alert and oriented x 3, no focal neurologic deficits.    Nicole Span,MD 04/19/2023, 9:42 AM   CC Lise Auer, MD

## 2023-04-21 ENCOUNTER — Telehealth: Payer: Self-pay | Admitting: *Deleted

## 2023-04-25 NOTE — Progress Notes (Signed)
Please inform the patient. CT negative for any acute abnormalities Send report to family physician

## 2023-04-26 ENCOUNTER — Telehealth: Payer: Self-pay | Admitting: Gastroenterology

## 2023-04-26 NOTE — Telephone Encounter (Signed)
Spoke to pt prior.  Documented in result note. Pt verbalized understanding with all questions answered.

## 2023-04-26 NOTE — Telephone Encounter (Signed)
Inbound call from patient returning phone call regarding recent imaging results. Requesting a call back. Please advise, thank you.

## 2023-04-28 ENCOUNTER — Encounter: Payer: Self-pay | Admitting: Gastroenterology

## 2023-04-28 NOTE — Telephone Encounter (Signed)
Inbound call from patient requesting to speak with a nurse in regards to her experiencing abdominal pain. Please advise.   Thank you

## 2023-04-28 NOTE — Telephone Encounter (Signed)
Patient called in to follow up from phone call 8/7 with Viviann Spare, RN. She is still having intermittent, generalized abdominal pain & nausea. Feels that her body aches & that at times the pain is squeezing. 5 lb weight loss in 1 week. Poor appetite. She is constipated at times, but takes a daily laxative. She is taking pantoprazole BID, levbid BID, and bentyl PRN without relief. She is scheduled for repeat EGD in 12 weeks. Last seen for EGD 7/31. Advised her I would route to Dr. Chales Abrahams for recommendations & in the meantime discussed ED precautions if symptoms continue to worsen.

## 2023-04-28 NOTE — Telephone Encounter (Signed)
Spoke with patient regarding MD recommendations. Currently holding Namenda. She prefers to have labs drawn at PCP office. Orders have been faxed & requested results be sent to Dr. Chales Abrahams [phone].

## 2023-04-28 NOTE — Telephone Encounter (Signed)
Negative CT Abdo/pelvis for any acute abnormalities EGD showed gastric ulcer. Bx- neg for malignancy or H. Pylori  Plan: -Lets get labs as per last OV -Repeat EGD in 12 weeks as scheduled -Hold Namenda -Also if she could see Dr. Debbe Odea (her PCP-to make sure there is no other etiology for weight loss) -Small but more frequent meals -If still with problems, trial of amitriptyline 25 mg p.o. nightly  RG

## 2023-05-01 DIAGNOSIS — G43719 Chronic migraine without aura, intractable, without status migrainosus: Secondary | ICD-10-CM | POA: Diagnosis not present

## 2023-05-01 DIAGNOSIS — E782 Mixed hyperlipidemia: Secondary | ICD-10-CM | POA: Diagnosis not present

## 2023-05-01 DIAGNOSIS — E1165 Type 2 diabetes mellitus with hyperglycemia: Secondary | ICD-10-CM | POA: Diagnosis not present

## 2023-05-01 LAB — LAB REPORT - SCANNED: A1c: 6.4

## 2023-05-02 LAB — LAB REPORT - SCANNED
Albumin, Urine POC: 24.1
Creatinine, POC: 90.8 mg/dL
Microalb Creat Ratio: 27

## 2023-05-03 ENCOUNTER — Telehealth: Payer: Self-pay | Admitting: Gastroenterology

## 2023-05-03 NOTE — Telephone Encounter (Signed)
Pt stated that she has been in pain for 6 months now, Pt had office visit with Dr. Chales Abrahams on 04/06/2023 and an Upper Endoscopy on 04/19/2023. Pt staed that she has been losing weight, not eating spicy foods. Pt stated that she is having lower abdominal pain and thinks its her hernia and ulcer. Pt stated that she is nauseated every day and is taking zofran that has been prescribed by her PCP. Pt Pt Last BM was yesterday. Take an OTC laxative everyday.  Pt was scheduled to see [name] NP on 05/04/2023 at 9:00 AM. Pt made aware. Location provided.  Pt verbalized understanding with all questions answered.

## 2023-05-03 NOTE — Telephone Encounter (Signed)
PT has an ulcer and hernia and is severe pain. Requesting call back to discuss symptoms.

## 2023-05-04 ENCOUNTER — Other Ambulatory Visit (INDEPENDENT_AMBULATORY_CARE_PROVIDER_SITE_OTHER): Payer: PPO

## 2023-05-04 ENCOUNTER — Telehealth: Payer: Self-pay

## 2023-05-04 ENCOUNTER — Ambulatory Visit (INDEPENDENT_AMBULATORY_CARE_PROVIDER_SITE_OTHER)
Admission: RE | Admit: 2023-05-04 | Discharge: 2023-05-04 | Disposition: A | Discharge status: Home / Self Care | Payer: PPO | Source: Ambulatory Visit | Attending: Nurse Practitioner | Admitting: Nurse Practitioner

## 2023-05-04 ENCOUNTER — Ambulatory Visit: Payer: PPO | Admitting: Nurse Practitioner

## 2023-05-04 ENCOUNTER — Encounter: Payer: Self-pay | Admitting: Nurse Practitioner

## 2023-05-04 VITALS — BP 120/88 | HR 56 | Ht 63.0 in | Wt 153.0 lb

## 2023-05-04 DIAGNOSIS — K253 Acute gastric ulcer without hemorrhage or perforation: Secondary | ICD-10-CM

## 2023-05-04 DIAGNOSIS — R1013 Epigastric pain: Secondary | ICD-10-CM | POA: Diagnosis not present

## 2023-05-04 DIAGNOSIS — K551 Chronic vascular disorders of intestine: Secondary | ICD-10-CM

## 2023-05-04 DIAGNOSIS — G8929 Other chronic pain: Secondary | ICD-10-CM | POA: Diagnosis not present

## 2023-05-04 DIAGNOSIS — K5900 Constipation, unspecified: Secondary | ICD-10-CM | POA: Diagnosis not present

## 2023-05-04 DIAGNOSIS — R109 Unspecified abdominal pain: Secondary | ICD-10-CM | POA: Diagnosis not present

## 2023-05-04 LAB — COMPREHENSIVE METABOLIC PANEL
ALT: 16 U/L (ref 0–35)
AST: 15 U/L (ref 0–37)
Albumin: 4 g/dL (ref 3.5–5.2)
Alkaline Phosphatase: 55 U/L (ref 39–117)
BUN: 14 mg/dL (ref 6–23)
CO2: 24 mEq/L (ref 19–32)
Calcium: 9.5 mg/dL (ref 8.4–10.5)
Chloride: 105 mEq/L (ref 96–112)
Creatinine, Ser: 0.7 mg/dL (ref 0.40–1.20)
GFR: 88.89 mL/min (ref >60.00)
Glucose, Bld: 132 mg/dL — ABNORMAL HIGH (ref 70–99)
Potassium: 4.1 mEq/L (ref 3.5–5.1)
Sodium: 137 mEq/L (ref 135–145)
Total Bilirubin: 0.9 mg/dL (ref 0.2–1.2)
Total Protein: 6.2 g/dL (ref 6.0–8.3)

## 2023-05-04 LAB — CBC WITH DIFFERENTIAL/PLATELET
Basophils Absolute: 0 10*3/uL (ref 0.0–0.1)
Basophils Relative: 0.6 % (ref 0.0–3.0)
Eosinophils Absolute: 0.1 10*3/uL (ref 0.0–0.7)
Eosinophils Relative: 1.8 % (ref 0.0–5.0)
HCT: 45.1 % (ref 36.0–46.0)
Hemoglobin: 15 g/dL (ref 12.0–15.0)
Lymphocytes Relative: 19.6 % (ref 12.0–46.0)
Lymphs Abs: 1.1 10*3/uL (ref 0.7–4.0)
MCHC: 33.2 g/dL (ref 30.0–36.0)
MCV: 95.9 fl (ref 78.0–100.0)
Monocytes Absolute: 0.5 10*3/uL (ref 0.1–1.0)
Monocytes Relative: 8.5 % (ref 3.0–12.0)
Neutro Abs: 3.8 10*3/uL (ref 1.4–7.7)
Neutrophils Relative %: 69.5 % (ref 43.0–77.0)
Platelets: 251 10*3/uL (ref 150.0–400.0)
RBC: 4.7 Mil/uL (ref 3.87–5.11)
RDW: 14 % (ref 11.5–15.5)
WBC: 5.4 10*3/uL (ref 4.0–10.5)

## 2023-05-04 MED ORDER — SUCRALFATE 1 G PO TABS: 1.0000 g | ORAL_TABLET | Freq: Two times a day (BID) | ORAL | 0 refills | Status: DC

## 2023-05-04 NOTE — Patient Instructions (Addendum)
Your provider has requested that you have an abdominal x ray before leaving today. Please go to the basement floor to our Radiology department for the test.  Your provider has requested that you go to the basement level for lab work before leaving today. Press "B" on the elevator. The lab is located at the first door on the left as you exit the elevator.  Continue Omeprazole 40 mg, 2 by mouth twice daily.  Contact your vascular specialist, Dr.Dixon today for urgent follow up.  Take Tylenol 500 mg, 2 by mouth twice daily as needed.  Carafate- 1 by mouth twice daily. Do not take within 2 hours of any other medications.  Miralax- every night as needed  Go to the ED if your abdominal pain worsens.  Due to recent changes in healthcare laws, you may see the results of your imaging and laboratory studies on MyChart before your provider has had a chance to review them.  We understand that in some cases there may be results that are confusing or concerning to you. Not all laboratory results come back in the same time frame and the provider may be waiting for multiple results in order to interpret others.  Please give [place] 48 hours in order for your provider to thoroughly review all the results before contacting the office for clarification of your results.   Thank you for trusting me with your gastrointestinal care!   [name], CRNP

## 2023-05-04 NOTE — Telephone Encounter (Signed)
Nicole Gerold, NP with [facility] called requesting that the pt's appts scheduled for November be moved earlier. Pt having worsening abd pain.  Caller: Patient  Concern: severe pain, low appetite, barely able to eat, nauseous, painful with movement, pain with ambulation  Location: abdomen  Description:  continually worsening over the past month, now almost to the point of being unbearable  Aggravating Factors: movement, walking  Quality:  pt seemed tearful  Resolution: Appointment scheduled for 1st available fasting [place] and Dr. Edilia Bo appt  Next Appt: Appointment scheduled for 8/16 for [place] and 8/22 for Dr. Edilia Bo. Informed her that if anything critical on the [place], she would be notified and given additional instructions.

## 2023-05-04 NOTE — Progress Notes (Signed)
Agreed -Stop ACE. She needs to get in touch with Dr Welton Flakes regarding ?amolidipine or any other anti-HTN med -Check C1/C3 esterase inhibitor.  -Agree with mesenteric Doppler and earlier appointment with Dr. Jen Mow -Trial of amitriptyline 25 mg p.o. nightly -She does not need to see Dr. Welton Flakes for possible anxiety -Check urine for porphobilinogen -Move follow-up EGD in 8 weeks  RG

## 2023-05-04 NOTE — Progress Notes (Signed)
05/04/2023 Nicole Hicks [id number] [birth year]   Chief Complaint: Abdominal pain   History of Present Illness: Nicole Hicks is a 67 year old female with a past medical history of arthritis, asthma, hyperlipidemia, hypertension, superior mesenteric artery stenosis, CVA, diabetes mellitus type 2, pernicious anemia, IBS, diverticulitis, colon polyps and GERD.  S/p cholecystectomy 2012.  She is known by Dr. Chales Abrahams. She presents today for further evaluation regarding chronic epigastric pain which radiates down to the central lower abdomen which has persisted for the past 6 months. She is actively pacing in the exam room due to constant pain which she stated is the same abdominal pain she's had for the past 6 months. However, there are times when her abdominal pain is less or more severe. She describes her epigastric pain as squeezing and tight. She also experienced an episode of dysphagia yesterday which is unusual. She ate mac and cheese last night and felt like it got hung up in the mid esophagus then eventually passed in 30 minutes without recurrence thus far today. She reported having labs done by her PCP 3 days ago which were unremarkable. Eating does not trigger her abdominal pain. She is taking Dicyclomine 20mg  bid, Hyoscyamine 0.375mg  po bid, Pantoprazole 40mg  bid and Omeprazole 20mg  every day. She has constipation, passes a BM 2 to 3 days weekly. Last BM as 2 days ago. She takes Metamucil daily and OTC laxative PRN, last took 4 days ago which resulted in passing a large amount of dark hard and loose stool. No rectal bleeding or black stools. She underwent an EGD 04/19/2023 which showed a 5cm hiatal hernia, non-bleeding gastric ulcers without evidence of H. Pylori or malignancy. She has known mesenteric artery stenosis. She was last seen by her vascular specialist, Dr. Edilia Bo, 02/02/2023. At that time, it was noted that she has tight superior mesenteric artery stenosis, however, he did not feel her symptoms  were consistent with mesenteric ischemia as she does not have postprandial abdominal pain or significant weight loss. A repeat  mesenteric doppler study was scheduled 07/2023.   PAST IMAGE STUDIES:   CTAP 04/18/2023:  FINDINGS: Lower Chest: No acute findings.   Hepatobiliary: No suspicious hepatic masses identified. Prior cholecystectomy. No evidence of biliary obstruction.   Pancreas:  No mass or inflammatory changes.   Spleen: Within normal limits in size and appearance.   Adrenals/Urinary Tract: No suspicious masses identified. 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 6 mm calculus seen in lower pole of left kidney, and 3 mm calculus seen in the left renal pelvis. Mild left renal pelvicaliectasis and ureterectasis remains stable, and no obstructing ureteral calculi are identified. Unremarkable unopacified urinary bladder.   Stomach/Bowel: Tiny hiatal hernia again noted. No evidence of obstruction, inflammatory process or abnormal fluid collections.   Vascular/Lymphatic: No pathologically enlarged lymph nodes. No acute vascular findings.   Reproductive:  No mass or other significant abnormality.   Other:  None.   Musculoskeletal:  No suspicious bone lesions identified.   IMPRESSION: Stable mild left renal pelvicaliectasis and ureterectasis. No ureteral calculi or other obstructing etiology visualized by CT.   Left nephrolithiasis.   Tiny hiatal hernia.  CT Abdo/pelvis 01/12/2023: -Chronic high-grade proximal SMA stenosis. -Small kidney stones -Small HH -Otherwise normal  CT Abdo/pelvis with contrast 09/2020: 1. No acute findings or explanation for diarrhea. 2. Nonspecific heterogeneous hepatic attenuation which may relate to steatosis or regenerating nodules. Correlate with liver function studies. No biliary dilatation post cholecystectomy. 3. Nonobstructive left renal calculi.  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 This could be secondary to a stricture or neoplasm. Urology consultation and correlation with urine analysis and urine cytology recommended. 4. Aortic Atherosclerosis (ICD10-I70.0). 5. These results will be called to the ordering clinician or representative by the Radiologist Assistant, and communication documented in the PACS or [employer].  PAST GI PROCEDURES:   EGD 04/19/2023 by Dr. Chales Abrahams: - 5 cm hiatal hernia.  - Non-bleeding gastric ulcer with no stigmata of bleeding. Biopsied.  - A few gastric polyps. (Resected x 3). 1. Surgical [P], small bowel - DUODENAL MUCOSA WITH NO SIGNIFICANT PATHOLOGY. 2. Surgical [P], gastric ulcer - ANTRAL/OXYNTIC MUCOSA WITH CHEMICAL/REACTIVE/REPARATIVE CHANGE, LAMINA PROPRIA EDEMA AND FOCAL EVIDENCE OF EROSION. - NO HELICOBACTER PYLORI ORGANISMS IDENTIFIED ON H&E STAINED SLIDE. - NEGATIVE FOR MALIGNANCY. 3. Surgical [P], gastric polyp, polyp (3) - FUNDIC GLAND POLYPS.  Colonoscopy 09/2015: Mild sigmoid diverticulosis,  normal TI No polyps  Colonoscopy 01/2009: Colon polyps removed  Past Medical History:  Diagnosis Date   Allergy    Arthritis    hands and feet   Asthma    Bell's palsy    2 episodes   Bronchospasm    Diabetes mellitus without complication (HCC)    Diverticulitis    GERD (gastroesophageal reflux disease)    History of colitis    History of colon polyps    Hyperlipidemia    Hypertension    IBS (irritable bowel syndrome)    Insomnia    Migraines    Osteoarthritis    Pernicious anemia    Stroke (HCC) 11/12/2019   Past Surgical History:  Procedure Laterality Date   ABDOMINAL HYSTERECTOMY  1992   BUNIONECTOMY     CHOLECYSTECTOMY  12/2010   COLONOSCOPY  10/14/2015   Mild sigmoid diverticulosis.    KNEE SURGERY      Current Outpatient Medications on File Prior to Visit  Medication Sig  Dispense Refill   ALPRAZolam (XANAX) 0.5 MG tablet Take 0.5 mg by mouth at bedtime as needed for anxiety.     Ascorbic Acid (VITAMIN C PO) Take by mouth daily.     atenolol (TENORMIN) 50 MG tablet Take 50 mg by mouth daily.     atorvastatin (LIPITOR) 80 MG tablet SMARTSIG:1 Tablet(s) By Mouth Every Evening     baclofen (LIORESAL) 10 MG tablet Take 5-10 mg by mouth 2 (two) times daily as needed for muscle spasms.     Biotin 5000 MCG TABS Take 5,000 mcg by mouth daily.     butalbital-acetaminophen-caffeine (FIORICET) 50-325-40 MG tablet Take 1 tablet by mouth every 8 (eight) hours as needed.     Cholecalciferol (VITAMIN D3) 2000 units TABS Take 2,000 mg by mouth daily.     citalopram (CELEXA) 20 MG tablet Take 40 mg by mouth daily.     dicyclomine (BENTYL) 20 MG tablet Take 20 mg by mouth as needed.      diphenoxylate-atropine (LOMOTIL) 2.5-0.025 MG tablet Take 1 tablet by mouth 4 (four) times daily as needed for diarrhea or loose stools. (Patient taking differently: Take 1 tablet by mouth as needed for diarrhea or loose stools.) 30 tablet 0   Erenumab-aooe 70 MG/ML SOAJ Inject 1 pen into the skin every 30 (thirty) days.     fenofibrate 54 MG tablet Take 54 mg by mouth daily.     gabapentin (NEURONTIN) 100 MG capsule Take 100 mg by  mouth at bedtime.     glimepiride (AMARYL) 2 MG tablet Take 2 mg by mouth daily with breakfast.     hyoscyamine (LEVBID) 0.375 MG 12 hr tablet Take 1 tablet (0.375 mg total) by mouth 2 (two) times daily. 180 tablet 3   levETIRAcetam (KEPPRA) 500 MG tablet Take 500 mg by mouth as directed.     lisinopril (ZESTRIL) 2.5 MG tablet Take 2.5 mg by mouth at bedtime.     loratadine (CLARITIN) 10 MG tablet Take 10 mg by mouth daily.     memantine (NAMENDA) 10 MG tablet Take 10 mg by mouth 2 (two) times daily.     Metoprolol Succinate 25 MG CS24 Take 25 mg by mouth daily.     montelukast (SINGULAIR) 10 MG tablet Take 10 mg by mouth at bedtime.     Multiple Vitamin  (MULTIVITAMIN) tablet Take 1 tablet by mouth daily.     naproxen (NAPROSYN) 500 MG tablet Take 500 mg by mouth 2 (two) times daily as needed.     omeprazole (PRILOSEC) 20 MG capsule Take 20 mg by mouth 2 (two) times daily as needed.     ondansetron (ZOFRAN) 8 MG tablet Take 8 mg by mouth as needed for nausea or vomiting.      pantoprazole (PROTONIX) 40 MG tablet Take 1 tablet (40 mg total) by mouth daily. 90 tablet 3   pioglitazone (ACTOS) 30 MG tablet Take 30 mg by mouth daily.     promethazine (PHENERGAN) 25 MG tablet Take 25 mg by mouth every 8 (eight) hours as needed.     ramelteon (ROZEREM) 8 MG tablet Take 8 mg by mouth as directed.     rOPINIRole (REQUIP) 0.5 MG tablet Take 0.5 mg by mouth as directed.     sitaGLIPtin (JANUVIA) 100 MG tablet Take 100 mg by mouth daily.     No current facility-administered medications on file prior to visit.   Allergies  Allergen Reactions   Meloxicam Hives   Onabotulinumtoxina Swelling    Face swelling, ptosis    Penicillins Hives and Rash   Sulfamethoxazole-Trimethoprim Hives   Cefdinir Rash   Estrogens Other (See Comments)    Stop estrogens due to right parietal lobe ischemic stroke. Stop estrogens due to right parietal lobe ischemic stroke.    Telithromycin Rash   Topiramate Other (See Comments)    Weight loss and metallic taste   Umeclidinium-Vilanterol Hives   Ultram [Tramadol Hcl] Hives   Hydrocodone-Acetaminophen Itching   Tape Rash    Surgical tape   Current Medications, Allergies, Past Medical History, Past Surgical History, Family History and Social History were reviewed in [employer] record.  Review of Systems:   Constitutional: Negative for fever, sweats, chills or weight loss.  Respiratory: Negative for shortness of breath.   Cardiovascular: Negative for chest pain, palpitations and leg swelling.  Gastrointestinal: See HPI.  Musculoskeletal: Negative for back pain or muscle aches.  Neurological:  Negative for dizziness, headaches or paresthesias.   Physical Exam: BP 120/88   Pulse (!) 56   Ht 5\' 3"  (1.6 m)   Wt 153 lb (69.4 kg)   BMI 27.10 kg/m    Wt Readings from Last 3 Encounters:  04/19/23 153 lb (69.4 kg)  04/06/23 153 lb 6.4 oz (69.6 kg)  02/02/23 147 lb (66.7 kg)    General: 67 year old female pacing in room, in NAD.  Head: Normocephalic and atraumatic. Eyes: No scleral icterus. Conjunctiva pink . Ears: Normal auditory  acuity. Mouth: Dentition intact. No ulcers or lesions.  Lungs: Clear throughout to auscultation. Heart: Regular rate and rhythm, no murmur. Abdomen: Soft. Nondistended. Epigastric tenderness. + Bruit to the central upper abdominal area. No masses or hepatomegaly. Normal bowel sounds x 4 quadrants.  Rectal: Deferred. Musculoskeletal: Symmetrical with no gross deformities. Extremities: No edema. Neurological: Alert oriented x 4. No focal deficits.  Psychological: Alert and cooperative. Normal mood and affect  Assessment and Recommendations:  67 year female with mesenteric artery stenosis with chronic epigastric pain which radiates to the central lower abdomen. EGD 04/19/2023 showed nonbleeding gastric ulcers. -Stop Pantoprazole -Take Omeprazole 40mg  po bid -Carafate 1 gm po bid, not to take within 2 hours of any other medication -CBC, CMP -I attempted to contact Dr. Adele Dan office, left a message on the nurse voicemail to contact patient for follow up and recommend earlier mesenteric doppler study -Patient instructed to go to th ED if her abdominal pain worsens  -Consider trial with Amitriptyline for abdominal pain -Tyelnol 500mg  2 tabs po bid -? Repeat EGD to assess for gastric ulcer healing  -? Check C4 and C1 esterase inhibitor to assess for abdominal angioedema -Consider stopping Lisinopril for possible ACE inhibitor induced angioedema  -I will consult with Dr. Chales Abrahams, complex case   Constipation, likely contributing to abdominal  pain -Miralax Q HS  History of colon polyps per colonoscopy 01/2009.  Colonoscopy 09/2015 showed diverticulosis without colon polyps -Next colonoscopy due 09/2025  History of CVA

## 2023-05-05 ENCOUNTER — Other Ambulatory Visit: Payer: Self-pay

## 2023-05-05 ENCOUNTER — Telehealth: Payer: Self-pay

## 2023-05-05 ENCOUNTER — Ambulatory Visit (HOSPITAL_COMMUNITY)
Admission: RE | Admit: 2023-05-05 | Discharge: 2023-05-05 | Disposition: A | Discharge status: Home / Self Care | Payer: PPO | Source: Ambulatory Visit | Attending: Vascular Surgery | Admitting: Vascular Surgery

## 2023-05-05 DIAGNOSIS — K551 Chronic vascular disorders of intestine: Secondary | ICD-10-CM | POA: Insufficient documentation

## 2023-05-05 DIAGNOSIS — G8929 Other chronic pain: Secondary | ICD-10-CM

## 2023-05-05 NOTE — Progress Notes (Signed)
Nicole Hicks, Case reviewed by Dr. Chales Abrahams and he agreed with my recommendations. See his addendum to office visit note.   Please contact the patient this morning and schedule her for a follow up EGD end of Sept (2 months from her 7/ 31/2024 EGD instead of 3 months).  Contact our lab and see if they can add C3, C4 levels, C1 esterase inhibitor level (checks for abdominal angioedema) and CRP (checks for vasculitis). If these labs cannot be added to yesterday's lab draw, have patient return to lab asap to have done. Also order urine porphobilinogen level.  Send in RX for Amitriptyline 25 mg one tab po Q HS for abdominal pain # 30, 1 refill  Instruct patient to stop Lisinopril (this medication could be causing abdominal angioedema/resulting in her unexplained abdominal pain) and she needs to let her PCP Dr. Welton Flakes know that we stopped it for that reason and PCP to consider prescribing Amlodipine or any other anti-HTN med (not any ACE or ARB antihypertensive meds).  She needs to follow up with her Dr. Welton Flakes, consider anxiety treatment   Follow up with vascular surgeon as I discussed with patient and I called Dr. Adele Dan office yesterday, left msg for them to contact patient for mesenteric artery stenosis follow up.  Please fax copy of this office note with addendums to Dr. Welton Flakes as his office is in [place] and not in the [facility] system. THX.

## 2023-05-05 NOTE — Telephone Encounter (Signed)
Author: Arnaldo Natal, NP Service: Gastroenterology Author Type: Nurse Practitioner  Filed: 05/05/2023  5:41 AM Encounter Date: 05/04/2023 Status: Signed  Editor: Arnaldo Natal, NP (Nurse Practitioner)   [place] Score, Case reviewed by Dr. Chales Abrahams and he agreed with my recommendations. See his addendum to office visit note.    Please contact the patient this morning and schedule her for a follow up EGD end of Sept (2 months from her 7/ 31/2024 EGD instead of 3 months).   Contact our lab and see if they can add C3, C4 levels, C1 esterase inhibitor level (checks for abdominal angioedema) and CRP (checks for vasculitis). If these labs cannot be added to yesterday's lab draw, have patient return to lab asap to have done. Also order urine porphobilinogen level.   Send in RX for Amitriptyline 25 mg one tab po Q HS for abdominal pain # 30, 1 refill   Instruct patient to stop Lisinopril (this medication could be causing abdominal angioedema/resulting in her unexplained abdominal pain) and she needs to let her PCP Dr. Welton Flakes know that we stopped it for that reason and PCP to consider prescribing Amlodipine or any other anti-HTN med (not any ACE or ARB antihypertensive meds).   She needs to follow up with her Dr. Welton Flakes, consider anxiety treatment    Follow up with vascular surgeon as I discussed with patient and I called Dr. Adele Dan office yesterday, left msg for them to contact patient for mesenteric artery stenosis follow up.   Please fax copy of this office note with addendums to Dr. Welton Flakes as his office is in [place] and not in the [facility] system. THX.

## 2023-05-05 NOTE — Telephone Encounter (Signed)
Spoke with patient regarding NP/MD recommendations. Dr. Chales Abrahams will be out of office at the end of September, moved EGD up to the soonest date closest to the desired 8 week time frame for 06/28/23 at 3:30 pm. Updated instructions sent to pt in mychart. She prefers labs be drawn at PCP office. Orders have been faxed to their office as well as copy of OV note to Dr. Welton Flakes. She had vascular US done today & has follow up with Dr. Edilia Bo on 05/11/23. Will make providers aware.

## 2023-05-08 DIAGNOSIS — K551 Chronic vascular disorders of intestine: Secondary | ICD-10-CM | POA: Diagnosis not present

## 2023-05-08 NOTE — Telephone Encounter (Signed)
Viviann Spare, see Kaira's note. Cancel order for Amitriptyline. Await further recommendations per Dr. Chales Abrahams. THX.

## 2023-05-09 NOTE — Telephone Encounter (Signed)
Hold off on amitriptyline She does need to see Dr. Park Breed for anxiety/insomnia-not sleeping well RG

## 2023-05-10 NOTE — Telephone Encounter (Signed)
Pt made aware to Hold off on amitriptyline She does need to see Dr. Park Breed for anxiety/insomnia-not sleeping well Pt verbalized understanding with all questions answered.

## 2023-05-11 ENCOUNTER — Encounter: Payer: Self-pay | Admitting: Vascular Surgery

## 2023-05-11 ENCOUNTER — Ambulatory Visit: Payer: PPO | Admitting: Vascular Surgery

## 2023-05-11 VITALS — BP 99/61 | HR 82 | Temp 97.9°F | Resp 20 | Ht 63.0 in | Wt 150.0 lb

## 2023-05-11 DIAGNOSIS — K551 Chronic vascular disorders of intestine: Secondary | ICD-10-CM

## 2023-05-11 NOTE — Progress Notes (Signed)
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  MEDICAL ISSUES:   STENOSIS SUPERIOR MESENTERIC ARTERY: I previously saw this patient in May with a stenosis of the superior mesenteric artery.  The celiac axis and IMA were widely patent on CT scan.  At that time Nicole Hicks did not have postprandial abdominal pain or weight loss.  I was not convinced that her symptoms were related to her superior mesenteric artery stenosis.  Since that time Nicole Hicks is undergone upper endoscopy and has a significant duodenal ulcer.  I suspect her pain is related to this.  Again her symptoms do not fit with symptoms from chronic mesenteric ischemia.  Nicole Hicks has no postprandial abdominal pain and no weight loss.  This reason I would not recommend angioplasty and stenting of the superior mesenteric artery at this time.  I have explained that there is certainly some risk associated with with this.  I think even if we addressed her SMA stenosis this would not significantly help with her symptoms.  If Nicole Hicks continues to have symptoms after her duodenal ulcer heals we could reconsider this.  Nicole Hicks understands that I will be retiring.  Nicole Hicks is already scheduled to see one of my partners later this year for follow-up.  We will have her keep that appointment.   HPI:   Nicole Hicks is a pleasant 67 y.o. female who I saw on 02/02/2023 with a superior mesenteric artery stenosis.  This stenosis was noted as far back as January 2022 on a CT scan.  The celiac axis is widely patent and the inferior mesenteric artery is also widely patent.  When I saw her in May I did not think her symptoms could be contributed to chronic mesenteric ischemia as Nicole Hicks did not have significant postprandial abdominal pain or significant weight loss.  The symptoms were gradual in onset.  Nicole Hicks does have a history of inflammatory bowel syndrome.  Therefore at that time I did not recommend an aggressive approach to her superior mesenteric artery stenosis.  I  recommended a follow-up visit in 6 months with a mesenteric duplex.  Nicole Hicks was on aspirin and was on a statin.  My history, the patient has had persistent abdominal pain which comes and goes.  Nicole Hicks recently had a upper endoscopy which showed a significant duodenal ulcer.  Her medications are being adjusted.  On my history Nicole Hicks does not describe postprandial abdominal pain.  Nicole Hicks has not had any weight loss.  Nicole Hicks states that Nicole Hicks gets the pain sometimes when Nicole Hicks walks or when Nicole Hicks picks things up.  Past Medical History:  Diagnosis Date   Allergy    Arthritis    hands and feet   Asthma    Bell's palsy    2 episodes   Bronchospasm    Diabetes mellitus without complication (HCC)    Diverticulitis    GERD (gastroesophageal reflux disease)    History of colitis    History of colon polyps    Hyperlipidemia    Hypertension    IBS (irritable bowel syndrome)    Insomnia    Migraines    Osteoarthritis    Pernicious anemia    Stroke (HCC) 11/12/2019    Family History  Problem Relation Age of Onset   Diabetes Mother    Heart disease Mother    Diabetes Father    Heart disease Father    Colon cancer Neg Hx     SOCIAL HISTORY: Social History   Tobacco Use  Smoking status: Never   Smokeless tobacco: Never  Substance Use Topics   Alcohol use: Not Currently    Allergies  Allergen Reactions   Meloxicam Hives   Onabotulinumtoxina Swelling    Face swelling, ptosis    Penicillins Hives and Rash   Sulfamethoxazole-Trimethoprim Hives   Cefdinir Rash   Estrogens Other (See Comments)    Stop estrogens due to right parietal lobe ischemic stroke. Stop estrogens due to right parietal lobe ischemic stroke.    Telithromycin Rash   Topiramate Other (See Comments)    Weight loss and metallic taste   Umeclidinium-Vilanterol Hives   Ultram [Tramadol Hcl] Hives   Hydrocodone-Acetaminophen Itching   Tape Rash    Surgical tape    Current Outpatient Medications  Medication Sig Dispense  Refill   aspirin EC 81 MG tablet Take 81 mg by mouth daily. Swallow whole.     atorvastatin (LIPITOR) 80 MG tablet SMARTSIG:1 Tablet(s) By Mouth Every Evening     baclofen (LIORESAL) 10 MG tablet Take 5-10 mg by mouth 2 (two) times daily as needed for muscle spasms.     BELSOMRA 15 MG TABS Take 1 tablet by mouth at bedtime.     Biotin 5000 MCG TABS Take 5,000 mcg by mouth daily.     citalopram (CELEXA) 20 MG tablet Take 40 mg by mouth daily.     empagliflozin (JARDIANCE) 25 MG TABS tablet Take 25 mg by mouth daily.     gabapentin (NEURONTIN) 100 MG capsule Take 100 mg by mouth at bedtime.     glimepiride (AMARYL) 2 MG tablet Take 2 mg by mouth daily with breakfast.     lisinopril (ZESTRIL) 2.5 MG tablet Take 2.5 mg by mouth at bedtime.     loratadine (CLARITIN) 10 MG tablet Take 10 mg by mouth daily.     Multiple Vitamin (MULTIVITAMIN) tablet Take 1 tablet by mouth daily.     omeprazole (PRILOSEC) 20 MG capsule Take 20 mg by mouth 2 (two) times daily as needed.     ondansetron (ZOFRAN) 8 MG tablet Take 8 mg by mouth as needed for nausea or vomiting.      pioglitazone (ACTOS) 30 MG tablet Take 30 mg by mouth daily.     ramelteon (ROZEREM) 8 MG tablet Take 8 mg by mouth as directed.     ALPRAZolam (XANAX) 0.5 MG tablet Take 0.5 mg by mouth at bedtime as needed for anxiety. (Patient not taking: Reported on 05/11/2023)     Ascorbic Acid (VITAMIN C PO) Take by mouth daily. (Patient not taking: Reported on 05/11/2023)     atenolol (TENORMIN) 50 MG tablet Take 50 mg by mouth daily. (Patient not taking: Reported on 05/11/2023)     butalbital-acetaminophen-caffeine (FIORICET) 50-325-40 MG tablet Take 1 tablet by mouth every 8 (eight) hours as needed. (Patient not taking: Reported on 05/11/2023)     Cholecalciferol (VITAMIN D3) 2000 units TABS Take 2,000 mg by mouth daily. (Patient not taking: Reported on 05/11/2023)     dicyclomine (BENTYL) 20 MG tablet Take 20 mg by mouth as needed.  (Patient not taking:  Reported on 05/11/2023)     diphenoxylate-atropine (LOMOTIL) 2.5-0.025 MG tablet Take 1 tablet by mouth 4 (four) times daily as needed for diarrhea or loose stools. (Patient not taking: Reported on 05/11/2023) 30 tablet 0   Erenumab-aooe 70 MG/ML SOAJ Inject 1 pen into the skin every 30 (thirty) days. (Patient not taking: Reported on 05/11/2023)     fenofibrate 54 MG tablet  Take 54 mg by mouth daily. (Patient not taking: Reported on 05/11/2023)     hyoscyamine (LEVBID) 0.375 MG 12 hr tablet Take 1 tablet (0.375 mg total) by mouth 2 (two) times daily. (Patient not taking: Reported on 05/11/2023) 180 tablet 3   levETIRAcetam (KEPPRA) 500 MG tablet Take 500 mg by mouth as directed. (Patient not taking: Reported on 05/11/2023)     memantine (NAMENDA) 10 MG tablet Take 10 mg by mouth 2 (two) times daily. (Patient not taking: Reported on 05/11/2023)     Metoprolol Succinate 25 MG CS24 Take 25 mg by mouth daily. (Patient not taking: Reported on 05/11/2023)     montelukast (SINGULAIR) 10 MG tablet Take 10 mg by mouth at bedtime. (Patient not taking: Reported on 05/11/2023)     naproxen (NAPROSYN) 500 MG tablet Take 500 mg by mouth 2 (two) times daily as needed. (Patient not taking: Reported on 05/11/2023)     pantoprazole (PROTONIX) 40 MG tablet Take 1 tablet (40 mg total) by mouth daily. (Patient not taking: Reported on 05/11/2023) 90 tablet 3   promethazine (PHENERGAN) 25 MG tablet Take 25 mg by mouth every 8 (eight) hours as needed. (Patient not taking: Reported on 05/11/2023)     rOPINIRole (REQUIP) 0.5 MG tablet Take 0.5 mg by mouth as directed. (Patient not taking: Reported on 05/11/2023)     sucralfate (CARAFATE) 1 g tablet Take 1 tablet (1 g total) by mouth 2 (two) times daily. Do not take within 2 hours of any other medications. (Patient not taking: Reported on 05/11/2023) 60 tablet 0   No current facility-administered medications for this visit.    REVIEW OF SYSTEMS:  [X]  denotes positive finding, [ ]  denotes  negative finding Cardiac  Comments:  Chest pain or chest pressure:    Shortness of breath upon exertion:    Short of breath when lying flat:    Irregular heart rhythm:        Vascular    Pain in calf, thigh, or hip brought on by ambulation:    Pain in feet at night that wakes you up from your sleep:     Blood clot in your veins:    Leg swelling:         Pulmonary    Oxygen at home:    Productive cough:     Wheezing:         Neurologic    Sudden weakness in arms or legs:     Sudden numbness in arms or legs:     Sudden onset of difficulty speaking or slurred speech:    Temporary loss of vision in one eye:     Problems with dizziness:         Gastrointestinal    Blood in stool:     Vomited blood:         Genitourinary    Burning when urinating:     Blood in urine:        Psychiatric    Major depression:         Hematologic    Bleeding problems:    Problems with blood clotting too easily:        Skin    Rashes or ulcers:        Constitutional    Fever or chills:     PHYSICAL EXAM:   Vitals:   05/11/23 1242  BP: 99/61  Pulse: 82  Resp: 20  Temp: 97.9 F (36.6 C)  SpO2: 98%  Weight:  150 lb (68 kg)  Height: 5\' 3"  (1.6 m)    GENERAL: The patient is a well-nourished female, in no acute distress. The vital signs are documented above. CARDIAC: There is a regular rate and rhythm.  VASCULAR: I do not detect carotid bruits. Nicole Hicks has palpable dorsalis pedis pulses bilaterally. PULMONARY: There is good air exchange bilaterally without wheezing or rales. ABDOMEN: Soft and non-tender with normal pitched bowel sounds.  MUSCULOSKELETAL: There are no major deformities or cyanosis. NEUROLOGIC: No focal weakness or paresthesias are detected. SKIN: There are no ulcers or rashes noted. PSYCHIATRIC: The patient has a normal affect.  DATA:    CT ABDOMEN PELVIS 04/18/2023: I reviewed the CT abdomen and pelvis that was done on 04/18/2023 because Nicole Hicks was having abdominal pain.   This shows Stable mild left renal pelvicaliectasis and ureterectasis. No ureteral calculi or other obstructing etiology visualized by CT. Left nephrolithiasis.  MESENTERIC DUPLEX: I have reviewed the mesenteric duplex scan was done on 05/05/2023.  This showed a greater than 70% superior mesenteric artery stenosis.  There were some mildly elevated velocities in the celiac axis.  Waverly [facility] [phone]

## 2023-05-12 ENCOUNTER — Encounter: Payer: Self-pay | Admitting: Gastroenterology

## 2023-05-15 ENCOUNTER — Encounter: Payer: PPO | Admitting: Gastroenterology

## 2023-05-15 DIAGNOSIS — Z20822 Contact with and (suspected) exposure to covid-19: Secondary | ICD-10-CM | POA: Diagnosis not present

## 2023-05-26 DIAGNOSIS — E1165 Type 2 diabetes mellitus with hyperglycemia: Secondary | ICD-10-CM | POA: Diagnosis not present

## 2023-05-26 DIAGNOSIS — J069 Acute upper respiratory infection, unspecified: Secondary | ICD-10-CM | POA: Diagnosis not present

## 2023-05-26 DIAGNOSIS — Z6826 Body mass index (BMI) 26.0-26.9, adult: Secondary | ICD-10-CM | POA: Diagnosis not present

## 2023-05-30 ENCOUNTER — Other Ambulatory Visit: Payer: Self-pay

## 2023-05-30 DIAGNOSIS — K551 Chronic vascular disorders of intestine: Secondary | ICD-10-CM

## 2023-06-01 DIAGNOSIS — E119 Type 2 diabetes mellitus without complications: Secondary | ICD-10-CM | POA: Diagnosis not present

## 2023-06-01 DIAGNOSIS — H25811 Combined forms of age-related cataract, right eye: Secondary | ICD-10-CM | POA: Diagnosis not present

## 2023-06-01 DIAGNOSIS — Z7984 Long term (current) use of oral hypoglycemic drugs: Secondary | ICD-10-CM | POA: Diagnosis not present

## 2023-06-12 ENCOUNTER — Other Ambulatory Visit (HOSPITAL_COMMUNITY): Payer: Self-pay

## 2023-06-12 ENCOUNTER — Telehealth: Payer: Self-pay | Admitting: Pharmacy Technician

## 2023-06-12 NOTE — Telephone Encounter (Signed)
Noted  

## 2023-06-15 ENCOUNTER — Telehealth: Payer: Self-pay | Admitting: Nurse Practitioner

## 2023-06-15 MED ORDER — SUCRALFATE 1 G PO TABS: 1.0000 g | ORAL_TABLET | Freq: Two times a day (BID) | ORAL | 1 refills | Status: AC

## 2023-06-15 NOTE — Telephone Encounter (Signed)
Please advise 

## 2023-06-15 NOTE — Telephone Encounter (Signed)
PT is calling to have a refill [facility]. Please advise.

## 2023-06-15 NOTE — Telephone Encounter (Signed)
DD, ok to refill Carafate 1 gm po bid, not to take within 2 hours of any other medication # 60, 1 additional refill. Thx.

## 2023-06-28 ENCOUNTER — Ambulatory Visit (AMBULATORY_SURGERY_CENTER): Payer: PPO | Admitting: Gastroenterology

## 2023-06-28 ENCOUNTER — Encounter: Payer: Self-pay | Admitting: Gastroenterology

## 2023-06-28 VITALS — BP 161/92 | HR 84 | Temp 96.6°F | Resp 13 | Ht 63.0 in | Wt 153.0 lb

## 2023-06-28 DIAGNOSIS — R1013 Epigastric pain: Secondary | ICD-10-CM

## 2023-06-28 DIAGNOSIS — K253 Acute gastric ulcer without hemorrhage or perforation: Secondary | ICD-10-CM | POA: Diagnosis not present

## 2023-06-28 DIAGNOSIS — J45909 Unspecified asthma, uncomplicated: Secondary | ICD-10-CM | POA: Diagnosis not present

## 2023-06-28 DIAGNOSIS — G8929 Other chronic pain: Secondary | ICD-10-CM

## 2023-06-28 DIAGNOSIS — I10 Essential (primary) hypertension: Secondary | ICD-10-CM | POA: Diagnosis not present

## 2023-06-28 MED ORDER — SODIUM CHLORIDE 0.9 % IV SOLN: 500.0000 mL | Freq: Once | INTRAVENOUS | Status: DC

## 2023-06-28 MED ORDER — HYOSCYAMINE SULFATE ER 0.375 MG PO TB12: 0.3750 mg | ORAL_TABLET | Freq: Two times a day (BID) | ORAL | 0 refills | Status: DC

## 2023-06-28 MED ORDER — SODIUM CHLORIDE 0.9% FLUSH
10.0000 mL | Freq: Two times a day (BID) | INTRAVENOUS | Status: DC
Start: 2023-06-28 — End: 2023-06-28

## 2023-06-28 NOTE — Op Note (Signed)
[facility] Patient Name: Nicole Hicks Procedure Date: 06/28/2023 11:14 AM MRN: [id number] Endoscopist: Lynann Bologna , MD, [id number] Age: 67 Referring MD:  Date of Birth: 07/24/1956 Gender: Female Account #: [id number] Procedure:                Upper GI endoscopy Indications:              FU gastric ulcers 03/2023 to ensure healing. Medicines:                Monitored Anesthesia Care Procedure:                Pre-Anesthesia Assessment:                           - Prior to the procedure, a History and Physical                            was performed, and patient medications and                            allergies were reviewed. The patient's tolerance of                            previous anesthesia was also reviewed. The risks                            and benefits of the procedure and the sedation                            options and risks were discussed with the patient.                            All questions were answered, and informed consent                            was obtained. Prior Anticoagulants: The patient has                            taken no anticoagulant or antiplatelet agents. ASA                            Grade Assessment: II - A patient with mild systemic                            disease. After reviewing the risks and benefits,                            the patient was deemed in satisfactory condition to                            undergo the procedure.                           After obtaining informed consent, the endoscope was  passed under direct vision. Throughout the                            procedure, the patient's blood pressure, pulse, and                            oxygen saturations were monitored continuously. The                            GIF [id number] #[id number] was introduced through the                            mouth, and advanced to the second part of duodenum.                            The upper GI  endoscopy was accomplished without                            difficulty. The patient tolerated the procedure                            well. Scope In: Scope Out: Findings:                 The lower third of the esophagus was mildly                            tortuous.                           A 5 cm hiatal hernia was present as previously                            noted. No [place] erosions.                           Gastric ulcer had completely healed. Gastric mucosa                            was normal. A few (4-5) diminutive sessile polyps                            (previously deemed to be fundic gland polyps) with                            no bleeding and no stigmata of recent bleeding were                            found in the gastric body.                           The examined duodenum was normal.                           The exam was otherwise without abnormality. Complications:  No immediate complications. Estimated Blood Loss:     Estimated blood loss: none. Impression:               - Complete healing of gastric ulcer.                           - Mod hiatal hernia. Recommendation:           - Patient has a contact number available for                            emergencies. The signs and symptoms of potential                            delayed complications were discussed with the                            patient. Return to normal activities tomorrow.                            Written discharge instructions were provided to the                            patient.                           - Resume previous diet.                           - Decrease Protonix to 40 mg p.o. QD                           - Please continue Levbid 0.375mg  po BID for now x 2                            more weeks, then reduce it to once a day x 2 weeks,                            then stop. #60.                           - Stop Carafate.                           - The  findings and recommendations were discussed                            with the patient's family. Lynann Bologna, MD 06/28/2023 11:44:49 AM This report has been signed electronically.

## 2023-06-28 NOTE — Progress Notes (Signed)
Sedate, gd SR, tolerated procedure well, VSS, report to RN 

## 2023-06-28 NOTE — Patient Instructions (Signed)
Discharge instructions given. Normal exam. Decrease Protonix to 40mg  by mouth daily. Prescription sent to pharmacy. Stop Carafate. Resume previous medications. YOU HAD AN ENDOSCOPIC PROCEDURE TODAY AT THE [facility]:   Refer to the procedure report that was given to you for any specific questions about what was found during the examination.  If the procedure report does not answer your questions, please call your gastroenterologist to clarify.  If you requested that your care partner not be given the details of your procedure findings, then the procedure report has been included in a sealed envelope for you to review at your convenience later.  YOU SHOULD EXPECT: Some feelings of bloating in the abdomen. Passage of more gas than usual.  Walking can help get rid of the air that was put into your GI tract during the procedure and reduce the bloating. If you had a lower endoscopy (such as a colonoscopy or flexible sigmoidoscopy) you may notice spotting of blood in your stool or on the toilet paper. If you underwent a bowel prep for your procedure, you may not have a normal bowel movement for a few days.  Please Note:  You might notice some irritation and congestion in your nose or some drainage.  This is from the oxygen used during your procedure.  There is no need for concern and it should clear up in a day or so.  SYMPTOMS TO REPORT IMMEDIATELY:   Following upper endoscopy (EGD)  Vomiting of blood or coffee ground material  New chest pain or pain under the shoulder blades  Painful or persistently difficult swallowing  New shortness of breath  Fever of 100F or higher  Black, tarry-looking stools  For urgent or emergent issues, a gastroenterologist can be reached at any hour by calling [phone]. Do not use MyChart messaging for urgent concerns.    DIET:  We do recommend a small meal at first, but then you may proceed to your regular diet.  Drink plenty of fluids but you  should avoid alcoholic beverages for 24 hours.  ACTIVITY:  You should plan to take it easy for the rest of today and you should NOT DRIVE or use heavy machinery until tomorrow (because of the sedation medicines used during the test).    FOLLOW UP: Our staff will call the number listed on your records the next business day following your procedure.  We will call around 7:15- 8:00 am to check on you and address any questions or concerns that you may have regarding the information given to you following your procedure. If we do not reach you, we will leave a message.     If any biopsies were taken you will be contacted by phone or by letter within the next 1-3 weeks.  Please call us at [phone] if you have not heard about the biopsies in 3 weeks.    SIGNATURES/CONFIDENTIALITY: You and/or your care partner have signed paperwork which will be entered into your electronic medical record.  These signatures attest to the fact that that the information above on your After Visit Summary has been reviewed and is understood.  Full responsibility of the confidentiality of this discharge information lies with you and/or your care-partner.

## 2023-06-29 ENCOUNTER — Telehealth: Payer: Self-pay

## 2023-06-29 NOTE — Telephone Encounter (Signed)
Attempted f/u call. No answer, left VM. 

## 2023-07-01 DIAGNOSIS — H1031 Unspecified acute conjunctivitis, right eye: Secondary | ICD-10-CM | POA: Diagnosis not present

## 2023-07-05 DIAGNOSIS — R110 Nausea: Secondary | ICD-10-CM | POA: Diagnosis not present

## 2023-07-05 DIAGNOSIS — R519 Headache, unspecified: Secondary | ICD-10-CM | POA: Diagnosis not present

## 2023-07-05 DIAGNOSIS — T39315A Adverse effect of propionic acid derivatives, initial encounter: Secondary | ICD-10-CM | POA: Diagnosis not present

## 2023-07-05 DIAGNOSIS — G4486 Cervicogenic headache: Secondary | ICD-10-CM | POA: Diagnosis not present

## 2023-07-05 DIAGNOSIS — G4440 Drug-induced headache, not elsewhere classified, not intractable: Secondary | ICD-10-CM | POA: Diagnosis not present

## 2023-07-05 DIAGNOSIS — G43E09 Chronic migraine with aura, not intractable, without status migrainosus: Secondary | ICD-10-CM | POA: Diagnosis not present

## 2023-07-05 DIAGNOSIS — M7918 Myalgia, other site: Secondary | ICD-10-CM | POA: Diagnosis not present

## 2023-07-05 DIAGNOSIS — Z79899 Other long term (current) drug therapy: Secondary | ICD-10-CM | POA: Diagnosis not present

## 2023-07-05 DIAGNOSIS — G478 Other sleep disorders: Secondary | ICD-10-CM | POA: Diagnosis not present

## 2023-07-05 DIAGNOSIS — G43719 Chronic migraine without aura, intractable, without status migrainosus: Secondary | ICD-10-CM | POA: Diagnosis not present

## 2023-07-12 ENCOUNTER — Encounter: Payer: PPO | Admitting: Gastroenterology

## 2023-07-24 DIAGNOSIS — G43719 Chronic migraine without aura, intractable, without status migrainosus: Secondary | ICD-10-CM | POA: Diagnosis not present

## 2023-07-31 NOTE — Progress Notes (Unsigned)
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 This is not causing chronic mesenteric ischemia. Patient should continue best medical therapy for atherosclerosis including:  Abstinence from all tobacco products. Blood glucose control with goal A1c < 7%. Blood pressure control with goal blood pressure < 140/90 mmHg. Lipid reduction therapy with goal LDL-C <100 mg/dL. Aspirin 81mg  PO QD.  Atorvastatin 40-80mg  PO QD (or other "high intensity" statin therapy).  Follow up in 12 months with mesenteric duplex.  CHIEF COMPLAINT: surveillance of superior mesenteric   HISTORY OF PRESENT ILLNESS: Nicole Hicks is a 67 y.o. female who previously saw Dr. Edilia Bo for superior mesenteric artery stenosis. This was noted as far back as 2022. On previous evaluations, the patient did not report symptoms typical of chronic mesenteric ischemia. Her celiac and inferior mesenteric arteries were widely patent. She was found to have a large duodenal ulcer on endoscopy which was likely the culprit for her abdominal pain.   08/01/23: Patient returns to clinic for surveillance of mesenteric artery stenosis.  The patient is thankfully asymptomatic from a mesenteric artery standpoint.  She does have a known history of peptic ulcer disease.  She does report some intolerance of spicy foods.  Does not have the typical postprandial pain of mesenteric ischemia.  She denies any unintentional weight loss.  Past Medical History:  Diagnosis Date   Allergy    Arthritis    hands and feet   Asthma    Bell's palsy    2 episodes   Bronchospasm    Diabetes mellitus without complication (HCC)    Diverticulitis    GERD (gastroesophageal reflux disease)    History of colitis    History of colon polyps    Hyperlipidemia    Hypertension    IBS (irritable bowel syndrome)    Insomnia    Migraines    Osteoarthritis    Pernicious anemia    Stroke (HCC)  11/12/2019    Past Surgical History:  Procedure Laterality Date   ABDOMINAL HYSTERECTOMY  1992   BUNIONECTOMY     CHOLECYSTECTOMY  12/2010   COLONOSCOPY  10/14/2015   Mild sigmoid diverticulosis.    KNEE SURGERY      Family History  Problem Relation Age of Onset   Diabetes Mother    Heart disease Mother    Diabetes Father    Heart disease Father    Colon cancer Neg Hx    Esophageal cancer Neg Hx    Rectal cancer Neg Hx    Stomach cancer Neg Hx     Social History   Socioeconomic History   Marital status: Married    Spouse name: Not on file   Number of children: Not on file   Years of education: Not on file   Highest education level: Not on file  Occupational History   Not on file  Tobacco Use   Smoking status: Never   Smokeless tobacco: Never  Vaping Use   Vaping status: Never Used  Substance and Sexual Activity   Alcohol use: Not Currently   Drug use: Never   Sexual activity: Not on file  Other Topics Concern   Not on file  Social History Narrative   Not on file   Social Determinants of Health   Financial Resource Strain: Not on file  Food Insecurity: Not on file  Transportation Needs: Not on file  Physical Activity: Not on file  Stress: Not on file  Social  Connections: Unknown (02/01/2022)   Received from [facility], [facility]   Social Network    Social Network: Not on file  Intimate Partner Violence: Unknown (12/24/2021)   Received from [facility], [facility]   HITS    Physically Hurt: Not on file    Insult or Talk Down To: Not on file    Threaten Physical Harm: Not on file    Scream or Curse: Not on file    Allergies  Allergen Reactions   Meloxicam Hives   Onabotulinumtoxina Swelling    Face swelling, ptosis    Penicillins Hives and Rash   Sulfamethoxazole-Trimethoprim Hives   Cefdinir Rash   Estrogens Other (See Comments)    Stop estrogens due to right parietal lobe ischemic stroke. Stop estrogens due to right parietal lobe  ischemic stroke.    Telithromycin Rash   Topiramate Other (See Comments)    Weight loss and metallic taste   Umeclidinium-Vilanterol Hives   Ultram [Tramadol Hcl] Hives   Hydrocodone-Acetaminophen Itching   Tape Rash    Surgical tape    Current Outpatient Medications  Medication Sig Dispense Refill   ALPRAZolam (XANAX) 0.5 MG tablet Take 0.5 mg by mouth at bedtime as needed for anxiety.     Ascorbic Acid (VITAMIN C PO) Take by mouth daily.     aspirin EC 81 MG tablet Take 81 mg by mouth daily. Swallow whole.     atenolol (TENORMIN) 50 MG tablet Take 50 mg by mouth daily.     atorvastatin (LIPITOR) 80 MG tablet SMARTSIG:1 Tablet(s) By Mouth Every Evening     baclofen (LIORESAL) 10 MG tablet Take 5-10 mg by mouth 2 (two) times daily as needed for muscle spasms.     BELSOMRA 15 MG TABS Take 1 tablet by mouth at bedtime.     Biotin 5000 MCG TABS Take 5,000 mcg by mouth daily.     butalbital-acetaminophen-caffeine (FIORICET) 50-325-40 MG tablet Take 1 tablet by mouth every 8 (eight) hours as needed.     Cholecalciferol (VITAMIN D3) 2000 units TABS Take 2,000 mg by mouth daily.     citalopram (CELEXA) 20 MG tablet Take 40 mg by mouth daily.     gabapentin (NEURONTIN) 100 MG capsule Take 100 mg by mouth at bedtime.     gabapentin (NEURONTIN) 300 MG capsule Take 300 mg by mouth at bedtime.     glimepiride (AMARYL) 2 MG tablet Take 2 mg by mouth daily with breakfast.     hyoscyamine (LEVBID) 0.375 MG 12 hr tablet Take 1 tablet (0.375 mg total) by mouth 2 (two) times daily. Continue for 2 more weeks twice a day then reduce to once daily for 2 weeks the stop. 60 tablet 0   levETIRAcetam (KEPPRA) 500 MG tablet Take 500 mg by mouth as directed.     lisinopril (ZESTRIL) 2.5 MG tablet Take 2.5 mg by mouth at bedtime.     loratadine (CLARITIN) 10 MG tablet Take 10 mg by mouth daily.     Multiple Vitamin (MULTIVITAMIN) tablet Take 1 tablet by mouth daily.     omeprazole (PRILOSEC) 20 MG capsule Take  20 mg by mouth 2 (two) times daily as needed.     pantoprazole (PROTONIX) 40 MG tablet Take 1 tablet (40 mg total) by mouth daily. 90 tablet 3   pioglitazone (ACTOS) 30 MG tablet Take 30 mg by mouth daily.     promethazine (PHENERGAN) 25 MG tablet Take 25 mg by mouth every 8 (eight) hours  as needed.     ramelteon (ROZEREM) 8 MG tablet Take 8 mg by mouth as directed.     rOPINIRole (REQUIP) 0.5 MG tablet Take 0.5 mg by mouth as directed.     sucralfate (CARAFATE) 1 g tablet Take 1 tablet (1 g total) by mouth 2 (two) times daily. Do not take within 2 hours of any other medications. 60 tablet 1   dicyclomine (BENTYL) 20 MG tablet Take 20 mg by mouth as needed.  (Patient not taking: Reported on 05/11/2023)     diphenoxylate-atropine (LOMOTIL) 2.5-0.025 MG tablet Take 1 tablet by mouth 4 (four) times daily as needed for diarrhea or loose stools. (Patient not taking: Reported on 05/11/2023) 30 tablet 0   Erenumab-aooe 70 MG/ML SOAJ Inject 1 pen into the skin every 30 (thirty) days. (Patient not taking: Reported on 05/11/2023)     fenofibrate 54 MG tablet Take 54 mg by mouth daily. (Patient not taking: Reported on 05/11/2023)     memantine (NAMENDA) 10 MG tablet Take 10 mg by mouth 2 (two) times daily. (Patient not taking: Reported on 05/11/2023)     Metoprolol Succinate 25 MG CS24 Take 25 mg by mouth daily. (Patient not taking: Reported on 05/11/2023)     montelukast (SINGULAIR) 10 MG tablet Take 10 mg by mouth at bedtime. (Patient not taking: Reported on 05/11/2023)     naproxen (NAPROSYN) 500 MG tablet Take 500 mg by mouth 2 (two) times daily as needed. (Patient not taking: Reported on 05/11/2023)     ondansetron (ZOFRAN) 8 MG tablet Take 8 mg by mouth as needed for nausea or vomiting.  (Patient not taking: Reported on 08/01/2023)     No current facility-administered medications for this visit.    PHYSICAL EXAM Vitals:   08/01/23 0836  BP: (!) 143/86  Pulse: 82  Resp: 20  Temp: 97.9 F (36.6 C)   SpO2: 100%  Weight: 150 lb (68 kg)  Height: 5\' 3"  (1.6 m)   Well-appearing elderly woman in no distress Regular rate and rhythm Unlabored breathing Nondistended abdomen  PERTINENT LABORATORY AND RADIOLOGIC DATA  Most recent CBC    Latest Ref Rng & Units 05/04/2023   10:20 AM  CBC  WBC 4.0 - 10.5 K/uL 5.4   Hemoglobin 12.0 - 15.0 g/dL 16.1   Hematocrit 09.6 - 46.0 % 45.1   Platelets 150.0 - 400.0 K/uL 251.0      Most recent CMP    Latest Ref Rng & Units 05/04/2023   10:20 AM 04/18/2023    4:01 PM  CMP  Glucose 70 - 99 mg/dL 045    BUN 6 - 23 mg/dL 14    Creatinine 4.09 - 1.20 mg/dL 8.11  9.14   Sodium 782 - 145 mEq/L 137    Potassium 3.5 - 5.1 mEq/L 4.1    Chloride 96 - 112 mEq/L 105    CO2 19 - 32 mEq/L 24    Calcium 8.4 - 10.5 mg/dL 9.5    Total Protein 6.0 - 8.3 g/dL 6.2    Total Bilirubin 0.2 - 1.2 mg/dL 0.9    Alkaline Phos 39 - 117 U/L 55    AST 0 - 37 U/L 15    ALT 0 - 35 U/L 16     Mesenteric duplex 70 and 99% stenosis in the superior mesenteric artery is again noted.  The celiac artery and inferior mesenteric artery are widely patent   Rande Brunt. Lenell Antu, MD FACS [facility]  Office Phone Number: [phone] 08/01/2023 8:40 AM   Total time spent on preparing this encounter including chart review, data review, collecting history, examining the patient, coordinating care for this established patient, 30 minutes.  Portions of this report may have been transcribed using voice recognition software.  Every effort has been made to ensure accuracy; however, inadvertent computerized transcription errors may still be present.

## 2023-08-01 ENCOUNTER — Ambulatory Visit: Payer: PPO | Admitting: Vascular Surgery

## 2023-08-01 ENCOUNTER — Encounter: Payer: Self-pay | Admitting: Vascular Surgery

## 2023-08-01 ENCOUNTER — Ambulatory Visit (HOSPITAL_COMMUNITY)
Admission: RE | Admit: 2023-08-01 | Discharge: 2023-08-01 | Disposition: A | Discharge status: Home / Self Care | Payer: PPO | Source: Ambulatory Visit | Attending: Vascular Surgery | Admitting: Vascular Surgery

## 2023-08-01 VITALS — BP 143/86 | HR 82 | Temp 97.9°F | Resp 20 | Ht 63.0 in | Wt 150.0 lb

## 2023-08-01 DIAGNOSIS — K551 Chronic vascular disorders of intestine: Secondary | ICD-10-CM

## 2023-08-11 ENCOUNTER — Other Ambulatory Visit: Payer: Self-pay

## 2023-08-11 DIAGNOSIS — K551 Chronic vascular disorders of intestine: Secondary | ICD-10-CM

## 2023-08-22 DIAGNOSIS — H25813 Combined forms of age-related cataract, bilateral: Secondary | ICD-10-CM | POA: Diagnosis not present

## 2023-08-31 DIAGNOSIS — Z23 Encounter for immunization: Secondary | ICD-10-CM | POA: Diagnosis not present

## 2023-09-01 DIAGNOSIS — R110 Nausea: Secondary | ICD-10-CM | POA: Diagnosis not present

## 2023-09-05 DIAGNOSIS — J329 Chronic sinusitis, unspecified: Secondary | ICD-10-CM | POA: Diagnosis not present

## 2023-09-05 DIAGNOSIS — Z6825 Body mass index (BMI) 25.0-25.9, adult: Secondary | ICD-10-CM | POA: Diagnosis not present

## 2023-09-05 DIAGNOSIS — J40 Bronchitis, not specified as acute or chronic: Secondary | ICD-10-CM | POA: Diagnosis not present

## 2023-09-22 DIAGNOSIS — H25811 Combined forms of age-related cataract, right eye: Secondary | ICD-10-CM | POA: Diagnosis not present

## 2023-09-22 DIAGNOSIS — Z01818 Encounter for other preprocedural examination: Secondary | ICD-10-CM | POA: Diagnosis not present

## 2023-10-03 DIAGNOSIS — H259 Unspecified age-related cataract: Secondary | ICD-10-CM | POA: Diagnosis not present

## 2023-10-03 DIAGNOSIS — H0220C Unspecified lagophthalmos, bilateral, upper and lower eyelids: Secondary | ICD-10-CM | POA: Diagnosis not present

## 2023-10-03 DIAGNOSIS — H25811 Combined forms of age-related cataract, right eye: Secondary | ICD-10-CM | POA: Diagnosis not present

## 2023-10-16 DIAGNOSIS — G43719 Chronic migraine without aura, intractable, without status migrainosus: Secondary | ICD-10-CM | POA: Diagnosis not present

## 2023-10-31 DIAGNOSIS — H52223 Regular astigmatism, bilateral: Secondary | ICD-10-CM | POA: Diagnosis not present

## 2023-10-31 DIAGNOSIS — H25812 Combined forms of age-related cataract, left eye: Secondary | ICD-10-CM | POA: Diagnosis not present

## 2023-10-31 DIAGNOSIS — H259 Unspecified age-related cataract: Secondary | ICD-10-CM | POA: Diagnosis not present

## 2023-11-07 DIAGNOSIS — Z23 Encounter for immunization: Secondary | ICD-10-CM | POA: Diagnosis not present

## 2023-11-07 DIAGNOSIS — M19041 Primary osteoarthritis, right hand: Secondary | ICD-10-CM | POA: Diagnosis not present

## 2023-11-07 DIAGNOSIS — Z79899 Other long term (current) drug therapy: Secondary | ICD-10-CM | POA: Diagnosis not present

## 2023-11-07 DIAGNOSIS — Z0000 Encounter for general adult medical examination without abnormal findings: Secondary | ICD-10-CM | POA: Diagnosis not present

## 2023-11-07 DIAGNOSIS — E1165 Type 2 diabetes mellitus with hyperglycemia: Secondary | ICD-10-CM | POA: Diagnosis not present

## 2023-11-07 DIAGNOSIS — M19042 Primary osteoarthritis, left hand: Secondary | ICD-10-CM | POA: Diagnosis not present

## 2023-11-07 DIAGNOSIS — Z9181 History of falling: Secondary | ICD-10-CM | POA: Diagnosis not present

## 2023-11-07 DIAGNOSIS — Z1331 Encounter for screening for depression: Secondary | ICD-10-CM | POA: Diagnosis not present

## 2023-11-07 DIAGNOSIS — Z77120 Contact with and (suspected) exposure to mold (toxic): Secondary | ICD-10-CM | POA: Diagnosis not present

## 2023-11-07 DIAGNOSIS — Z1339 Encounter for screening examination for other mental health and behavioral disorders: Secondary | ICD-10-CM | POA: Diagnosis not present

## 2023-11-07 DIAGNOSIS — Z6824 Body mass index (BMI) 24.0-24.9, adult: Secondary | ICD-10-CM | POA: Diagnosis not present

## 2023-11-10 DIAGNOSIS — Z961 Presence of intraocular lens: Secondary | ICD-10-CM | POA: Diagnosis not present

## 2023-11-27 ENCOUNTER — Telehealth: Payer: Self-pay | Admitting: Gastroenterology

## 2023-11-27 NOTE — Telephone Encounter (Signed)
 Patient called stated she is having severe stomach cramps and pain with nausea and also breaking out in sweat.

## 2023-11-27 NOTE — Telephone Encounter (Signed)
 Left message for pt to call back

## 2023-11-28 NOTE — Telephone Encounter (Signed)
 Pt stated that she has been having severe abdominal pain, nausea, and diarrhea for 1 and 1/2 weeks. Pt was scheduled to see Quentin Mulling PA on 11/29/2023 at 2:10 PM. Pt made aware.  Pt verbalized understanding with all questions answered.

## 2023-11-29 ENCOUNTER — Ambulatory Visit: Admitting: Physician Assistant

## 2023-11-29 ENCOUNTER — Other Ambulatory Visit (INDEPENDENT_AMBULATORY_CARE_PROVIDER_SITE_OTHER)

## 2023-11-29 ENCOUNTER — Ambulatory Visit (INDEPENDENT_AMBULATORY_CARE_PROVIDER_SITE_OTHER)
Admission: RE | Admit: 2023-11-29 | Discharge: 2023-11-29 | Disposition: A | Discharge status: Home / Self Care | Source: Ambulatory Visit | Attending: Physician Assistant | Admitting: Physician Assistant

## 2023-11-29 VITALS — BP 130/86 | HR 79 | Ht 63.0 in | Wt 149.0 lb

## 2023-11-29 DIAGNOSIS — R197 Diarrhea, unspecified: Secondary | ICD-10-CM

## 2023-11-29 DIAGNOSIS — R1013 Epigastric pain: Secondary | ICD-10-CM

## 2023-11-29 DIAGNOSIS — G8929 Other chronic pain: Secondary | ICD-10-CM

## 2023-11-29 DIAGNOSIS — K219 Gastro-esophageal reflux disease without esophagitis: Secondary | ICD-10-CM

## 2023-11-29 DIAGNOSIS — N200 Calculus of kidney: Secondary | ICD-10-CM | POA: Diagnosis not present

## 2023-11-29 DIAGNOSIS — K588 Other irritable bowel syndrome: Secondary | ICD-10-CM | POA: Diagnosis not present

## 2023-11-29 DIAGNOSIS — K5900 Constipation, unspecified: Secondary | ICD-10-CM | POA: Diagnosis not present

## 2023-11-29 DIAGNOSIS — K449 Diaphragmatic hernia without obstruction or gangrene: Secondary | ICD-10-CM

## 2023-11-29 DIAGNOSIS — R109 Unspecified abdominal pain: Secondary | ICD-10-CM | POA: Diagnosis not present

## 2023-11-29 DIAGNOSIS — K253 Acute gastric ulcer without hemorrhage or perforation: Secondary | ICD-10-CM

## 2023-11-29 LAB — COMPREHENSIVE METABOLIC PANEL
ALT: 13 U/L (ref 0–35)
AST: 15 U/L (ref 0–37)
Albumin: 4.3 g/dL (ref 3.5–5.2)
Alkaline Phosphatase: 96 U/L (ref 39–117)
BUN: 18 mg/dL (ref 6–23)
CO2: 26 meq/L (ref 19–32)
Calcium: 9.7 mg/dL (ref 8.4–10.5)
Chloride: 104 meq/L (ref 96–112)
Creatinine, Ser: 0.62 mg/dL (ref 0.40–1.20)
GFR: 91.16 mL/min (ref >60.00)
Glucose, Bld: 148 mg/dL — ABNORMAL HIGH (ref 70–99)
Potassium: 3.9 meq/L (ref 3.5–5.1)
Sodium: 138 meq/L (ref 135–145)
Total Bilirubin: 0.5 mg/dL (ref 0.2–1.2)
Total Protein: 6.7 g/dL (ref 6.0–8.3)

## 2023-11-29 LAB — URINALYSIS, ROUTINE W REFLEX MICROSCOPIC
Bilirubin Urine: NEGATIVE
Ketones, ur: NEGATIVE
Leukocytes,Ua: NEGATIVE
Nitrite: NEGATIVE
Specific Gravity, Urine: 1.01 (ref 1.000–1.030)
Total Protein, Urine: NEGATIVE
Urine Glucose: NEGATIVE
Urobilinogen, UA: 0.2 (ref 0.0–1.0)
pH: 8 (ref 5.0–8.0)

## 2023-11-29 LAB — CBC WITH DIFFERENTIAL/PLATELET
Basophils Absolute: 0.1 10*3/uL (ref 0.0–0.1)
Basophils Relative: 0.9 % (ref 0.0–3.0)
Eosinophils Absolute: 0 10*3/uL (ref 0.0–0.7)
Eosinophils Relative: 0.8 % (ref 0.0–5.0)
HCT: 42.7 % (ref 36.0–46.0)
Hemoglobin: 14.3 g/dL (ref 12.0–15.0)
Lymphocytes Relative: 26.2 % (ref 12.0–46.0)
Lymphs Abs: 1.6 10*3/uL (ref 0.7–4.0)
MCHC: 33.4 g/dL (ref 30.0–36.0)
MCV: 93.4 fl (ref 78.0–100.0)
Monocytes Absolute: 0.5 10*3/uL (ref 0.1–1.0)
Monocytes Relative: 8.1 % (ref 3.0–12.0)
Neutro Abs: 4 10*3/uL (ref 1.4–7.7)
Neutrophils Relative %: 64 % (ref 43.0–77.0)
Platelets: 297 10*3/uL (ref 150.0–400.0)
RBC: 4.57 Mil/uL (ref 3.87–5.11)
RDW: 13.3 % (ref 11.5–15.5)
WBC: 6.2 10*3/uL (ref 4.0–10.5)

## 2023-11-29 LAB — SEDIMENTATION RATE: Sed Rate: 18 mm/h (ref 0–30)

## 2023-11-29 MED ORDER — PANTOPRAZOLE SODIUM 40 MG PO TBEC: 40.0000 mg | DELAYED_RELEASE_TABLET | Freq: Two times a day (BID) | ORAL | 0 refills | Status: AC

## 2023-11-29 MED ORDER — HYOSCYAMINE SULFATE ER 0.375 MG PO TB12: 0.3750 mg | ORAL_TABLET | Freq: Two times a day (BID) | ORAL | 0 refills | Status: AC

## 2023-11-29 NOTE — Patient Instructions (Addendum)
 Your provider has requested that you go to the basement level for lab work before leaving today. Press "B" on the elevator. The lab is located at the first door on the left as you exit the elevator.  Your provider has requested that you have an abdominal x ray before leaving today. Please go to the basement floor to our Radiology department for the test.  First do a trial off milk/lactose products if you use them.  Add fiber like benefiber or citracel once a day Increase activity Can do trial of IBGard which is over the counter for AB pain- Take 1-2 capsules once a day for maintence or twice a day during a flare Can send in an anti spasm medication, Levsin,     FODMAP stands for fermentable oligo-, di-, mono-saccharides and polyols (1). These are the scientific terms used to classify groups of carbs that are difficult for our body to digest and that are notorious for triggering digestive symptoms like bloating, gas, loose stools and stomach pain.   You can try low FODMAP diet  - start with eliminating just one column at a time that you feel may be a trigger for you. - the table at the very bottom contains foods that are low in FODMAPs   Sometimes trying to eliminate the FODMAP's from your diet is difficult or tricky, if you are stuggling with trying to do the elimination diet you can try an enzyme.  There is a food enzymes that you sprinkle in or on your food that helps break down the FODMAP. You can read more about the enzyme by going to this site: https://fodzyme.com/  Small intestinal bacterial overgrowth (SIBO) occurs when there is an abnormal increase in the overall bacterial population in the small intestine -- particularly types of bacteria not commonly found in that part of the digestive tract. Small intestinal bacterial overgrowth (SIBO) commonly results when a circumstance -- such as surgery or disease -- slows the passage of food and waste products in the digestive tract, creating  a breeding ground for bacteria.  Signs and symptoms of SIBO often include: Loss of appetite Abdominal pain Nausea Bloating An uncomfortable feeling of fullness after eating Diarrhea or constipation, depending on the type of gas produced  What foods trigger SIBO? While foods aren't the original cause of SIBO, certain foods do encourage the overgrowth of the wrong bacteria in your small intestine. If you're feeding them their favorite foods, they're going to grow more, and that will trigger more of your SIBO symptoms. By the same token, you can help reduce the overgrowth by starving the problematic bacteria of their favorite foods. This strategy has led to a number of proposed SIBO eating plans. The plans vary, and so do individual results. But in general, they tend to recommend limiting carbohydrates.  These include: Sugars and sweeteners. Fruits and starchy vegetables. Dairy products. Grains.  There is a test for this we can do called a breath test, if you are positive we will treat you with an antibiotic to see if it helps.  Your symptoms are very suspicious for this condition, as discussed, we will start you on an antibiotic to see if this helps.

## 2023-11-29 NOTE — Progress Notes (Signed)
 11/29/2023 Nicole Hicks [id number] 1955/12/15  Referring provider: Lise Auer, MD Primary GI doctor: Dr. Chales Hicks  ASSESSMENT AND PLAN:   Epigastric Ab pain, GERD, diarrhea 5 CM HH but resolved gastric ulcer on EGD 06/2023 Negative complements, CRP, no vasculitis, negative porphobilinogen,  Vascular visit, discomfort not from SMA stenosis, not with eating, not with weight loss Pain had improved until recently, occurring every 6 months Then Feb 28 after lunch out at [employer] she had tremendous GERD, then days after that she had epigastric pain and diarrhea, no fever chills.  Diarrhea 3-5 x a day but with her migraine medications she has constipation. No BM x 2-3 days ? AB migraines, currently getting infusions with neuro has some nausea with movement- follow up neuro ?constipation with previous KUB showing this, will get KUB- suspicious for IBS-C No AB wall pain    Patient Care Team: Nicole Auer, MD as PCP - General (Family Medicine)  HISTORY OF PRESENT ILLNESS: 68 y.o. female with a past medical history of arthritis, asthma, hyperlipidemia, hypertension, superior mesenteric artery stenosis, CVA, diabetes mellitus type 2, pernicious anemia, IBS, diverticulitis, colon polyps and GERD.  S/p cholecystectomy 2012. and others listed below presents for evaluation of AB pain, nausea and diarrhea.   Patient was seen August 15th 2024 with complaint of central abdominal pain with [name], NP there was concern for  mesenteric artery stenosis, abdominal angioedema. Patient had normal CRP normal complements unremarkable prophobilirubin. EGD with Dr. Chales Hicks 06/28/2023 showed completely healed gastric ulcer and moderate hiatal hernia 5 cm no Cameron erosions. KUB showed constipation. 08/01/2023 patient had office visit with Dr. Aliene Hicks vascular surgery.  Who felt the mesenteric artery stenosis was not causing patient's symptoms.  And continue medical management for  atherosclerosis. Patient does have history of chronic migraines and follows with Dr. Donneta Hicks with [facility], getting infusions of VYEPTI .  discussed the use of AI scribe software for clinical note transcription with the patient, who gave verbal consent to proceed.  History of Present Illness            She  reports that she has never smoked. She has never used smokeless tobacco. She reports that she does not currently use alcohol. She reports that she does not use drugs.  RELEVANT GI HISTORY, IMAGING AND LABS: CT Abdo/pelvis 01/12/2023: -Chronic high-grade proximal SMA stenosis. -Small kidney stones -Small HH -Otherwise normal  CTAP 04/18/2023:   FINDINGS: Lower Chest: No acute findings.   Hepatobiliary: No suspicious hepatic masses identified. Prior cholecystectomy. No evidence of biliary obstruction.   Pancreas:  No mass or inflammatory changes.   Spleen: Within normal limits in size and appearance.   Adrenals/Urinary Tract: No suspicious masses identified. 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 6 mm calculus seen in lower pole of left kidney, and 3 mm calculus seen in the left renal pelvis. Mild left renal pelvicaliectasis and ureterectasis remains stable, and no obstructing ureteral calculi are identified. Unremarkable unopacified urinary bladder.   Stomach/Bowel: Tiny hiatal hernia again noted. No evidence of obstruction, inflammatory process or abnormal fluid collections.   Vascular/Lymphatic: No pathologically enlarged lymph nodes. No acute vascular findings.   Reproductive:  No mass or other significant abnormality.   Other:  None.   Musculoskeletal:  No suspicious bone lesions identified.   IMPRESSION: Stable mild left renal pelvicaliectasis and ureterectasis. No ureteral calculi or other obstructing etiology visualized by CT. Left nephrolithiasis. Tiny hiatal hernia.    CT Abdo/pelvis  with contrast 09/2020: 1. No acute findings or  explanation for diarrhea. 2. Nonspecific heterogeneous hepatic attenuation which may relate to steatosis or regenerating nodules. Correlate with liver function studies. No biliary dilatation post cholecystectomy. 3. Nonobstructive left renal calculi. 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 This could be secondary to a stricture or neoplasm. Urology consultation and correlation with urine analysis and urine cytology recommended. 4. Aortic Atherosclerosis (ICD10-I70.0). 5. These results will be called to the ordering clinician or representative by the Radiologist Assistant, and communication documented in the PACS or [employer].   PAST GI PROCEDURES:   EGD 06/2023  - The lower third of the esophagus was mildly tortuous. - A 5 cm hiatal hernia was present as previously noted. No [place] erosions. - Gastric ulcer had completely healed. Gastric mucosa was normal. A few ( 4- 5) diminutive sessile polyps ( previously deemed to be fundic gland polyps) with no bleeding and no stigmata of recent bleeding were found in the gastric body. - The examined duodenum was normal. - The exam was otherwise without abnormality. EGD 04/19/2023 by Dr. Chales Hicks: - 5 cm hiatal hernia.  - Non-bleeding gastric ulcer with no stigmata of bleeding. Biopsied.  - A few gastric polyps. (Resected x 3). 1. Surgical [P], small bowel - DUODENAL MUCOSA WITH NO SIGNIFICANT PATHOLOGY. 2. Surgical [P], gastric ulcer - ANTRAL/OXYNTIC MUCOSA WITH CHEMICAL/REACTIVE/REPARATIVE CHANGE, LAMINA PROPRIA EDEMA AND FOCAL EVIDENCE OF EROSION. - NO HELICOBACTER PYLORI ORGANISMS IDENTIFIED ON H&E STAINED SLIDE. - NEGATIVE FOR MALIGNANCY. 3. Surgical [P], gastric polyp, polyp (3) - FUNDIC GLAND POLYPS.   Colonoscopy 09/2015: Mild sigmoid diverticulosis,  normal TI No polyps   Colonoscopy  01/2009: Colon polyps removed  CBC    Component Value Date/Time   WBC 5.4 05/04/2023 1020   RBC 4.70 05/04/2023 1020   HGB 15.0 05/04/2023 1020   HCT 45.1 05/04/2023 1020   PLT 251.0 05/04/2023 1020   MCV 95.9 05/04/2023 1020   MCHC 33.2 05/04/2023 1020   RDW 14.0 05/04/2023 1020   LYMPHSABS 1.1 05/04/2023 1020   MONOABS 0.5 05/04/2023 1020   EOSABS 0.1 05/04/2023 1020   BASOSABS 0.0 05/04/2023 1020   Recent Labs    05/04/23 1020  HGB 15.0    CMP     Component Value Date/Time   NA 137 05/04/2023 1020   K 4.1 05/04/2023 1020   CL 105 05/04/2023 1020   CO2 24 05/04/2023 1020   GLUCOSE 132 (H) 05/04/2023 1020   BUN 14 05/04/2023 1020   CREATININE 0.70 05/04/2023 1020   CALCIUM 9.5 05/04/2023 1020   PROT 6.2 05/04/2023 1020   ALBUMIN 4.0 05/04/2023 1020   AST 15 05/04/2023 1020   ALT 16 05/04/2023 1020   ALKPHOS 55 05/04/2023 1020   BILITOT 0.9 05/04/2023 1020      Latest Ref Rng & Units 05/04/2023   10:20 AM  Hepatic Function  Total Protein 6.0 - 8.3 g/dL 6.2   Albumin 3.5 - 5.2 g/dL 4.0   AST 0 - 37 U/L 15   ALT 0 - 35 U/L 16   Alk Phosphatase 39 - 117 U/L 55   Total Bilirubin 0.2 - 1.2 mg/dL 0.9       Current Medications:   Current Outpatient Medications (Endocrine & Metabolic):    glimepiride (AMARYL) 2 MG tablet, Take 2 mg by mouth daily with breakfast.   pioglitazone (ACTOS) 30  MG tablet, Take 30 mg by mouth daily.  Current Outpatient Medications (Cardiovascular):    atenolol (TENORMIN) 50 MG tablet, Take 50 mg by mouth daily.   atorvastatin (LIPITOR) 80 MG tablet, SMARTSIG:1 Tablet(s) By Mouth Every Evening   fenofibrate 54 MG tablet, Take 54 mg by mouth daily. (Patient not taking: Reported on 05/11/2023)   lisinopril (ZESTRIL) 2.5 MG tablet, Take 2.5 mg by mouth at bedtime.   Metoprolol Succinate 25 MG CS24, Take 25 mg by mouth daily. (Patient not taking: Reported on 05/11/2023)  Current Outpatient Medications (Respiratory):    loratadine  (CLARITIN) 10 MG tablet, Take 10 mg by mouth daily.   montelukast (SINGULAIR) 10 MG tablet, Take 10 mg by mouth at bedtime. (Patient not taking: Reported on 05/11/2023)   promethazine (PHENERGAN) 25 MG tablet, Take 25 mg by mouth every 8 (eight) hours as needed.  Current Outpatient Medications (Analgesics):    aspirin EC 81 MG tablet, Take 81 mg by mouth daily. Swallow whole.   butalbital-acetaminophen-caffeine (FIORICET) 50-325-40 MG tablet, Take 1 tablet by mouth every 8 (eight) hours as needed.   Erenumab-aooe 70 MG/ML SOAJ, Inject 1 pen into the skin every 30 (thirty) days. (Patient not taking: Reported on 05/11/2023)   naproxen (NAPROSYN) 500 MG tablet, Take 500 mg by mouth 2 (two) times daily as needed. (Patient not taking: Reported on 05/11/2023)   Current Outpatient Medications (Other):    ALPRAZolam (XANAX) 0.5 MG tablet, Take 0.5 mg by mouth at bedtime as needed for anxiety.   Ascorbic Acid (VITAMIN C PO), Take by mouth daily.   baclofen (LIORESAL) 10 MG tablet, Take 5-10 mg by mouth 2 (two) times daily as needed for muscle spasms.   BELSOMRA 15 MG TABS, Take 1 tablet by mouth at bedtime.   Biotin 5000 MCG TABS, Take 5,000 mcg by mouth daily.   Cholecalciferol (VITAMIN D3) 2000 units TABS, Take 2,000 mg by mouth daily.   citalopram (CELEXA) 20 MG tablet, Take 40 mg by mouth daily.   dicyclomine (BENTYL) 20 MG tablet, Take 20 mg by mouth as needed.  (Patient not taking: Reported on 05/11/2023)   diphenoxylate-atropine (LOMOTIL) 2.5-0.025 MG tablet, Take 1 tablet by mouth 4 (four) times daily as needed for diarrhea or loose stools. (Patient not taking: Reported on 05/11/2023)   gabapentin (NEURONTIN) 100 MG capsule, Take 100 mg by mouth at bedtime.   gabapentin (NEURONTIN) 300 MG capsule, Take 300 mg by mouth at bedtime.   hyoscyamine (LEVBID) 0.375 MG 12 hr tablet, Take 1 tablet (0.375 mg total) by mouth 2 (two) times daily. Continue for 2 more weeks twice a day then reduce to once daily  for 2 weeks the stop.   levETIRAcetam (KEPPRA) 500 MG tablet, Take 500 mg by mouth as directed.   memantine (NAMENDA) 10 MG tablet, Take 10 mg by mouth 2 (two) times daily. (Patient not taking: Reported on 05/11/2023)   Multiple Vitamin (MULTIVITAMIN) tablet, Take 1 tablet by mouth daily.   omeprazole (PRILOSEC) 20 MG capsule, Take 20 mg by mouth 2 (two) times daily as needed.   ondansetron (ZOFRAN) 8 MG tablet, Take 8 mg by mouth as needed for nausea or vomiting.  (Patient not taking: Reported on 08/01/2023)   pantoprazole (PROTONIX) 40 MG tablet, Take 1 tablet (40 mg total) by mouth daily.   ramelteon (ROZEREM) 8 MG tablet, Take 8 mg by mouth as directed.   rOPINIRole (REQUIP) 0.5 MG tablet, Take 0.5 mg by mouth as directed.   sucralfate (CARAFATE)  1 g tablet, Take 1 tablet (1 g total) by mouth 2 (two) times daily. Do not take within 2 hours of any other medications.  Medical History:  Past Medical History:  Diagnosis Date   Allergy    Arthritis    hands and feet   Asthma    Bell's palsy    2 episodes   Bronchospasm    Diabetes mellitus without complication (HCC)    Diverticulitis    GERD (gastroesophageal reflux disease)    History of colitis    History of colon polyps    Hyperlipidemia    Hypertension    IBS (irritable bowel syndrome)    Insomnia    Migraines    Osteoarthritis    Pernicious anemia    Stroke (HCC) 11/12/2019   Allergies:  Allergies  Allergen Reactions   Meloxicam Hives   Onabotulinumtoxina Swelling    Face swelling, ptosis    Penicillins Hives and Rash   Sulfamethoxazole-Trimethoprim Hives   Cefdinir Rash   Estrogens Other (See Comments)    Stop estrogens due to right parietal lobe ischemic stroke. Stop estrogens due to right parietal lobe ischemic stroke.    Telithromycin Rash   Topiramate Other (See Comments)    Weight loss and metallic taste   Umeclidinium-Vilanterol Hives   Ultram [Tramadol Hcl] Hives   Hydrocodone-Acetaminophen Itching    Tape Rash    Surgical tape     Surgical History:  She  has a past surgical history that includes Colonoscopy (10/14/2015); Cholecystectomy (12/2010); Abdominal hysterectomy (1992); Bunionectomy; and Knee surgery. Family History:  Her family history includes Diabetes in her father and mother; Heart disease in her father and mother.  REVIEW OF SYSTEMS  : All other systems reviewed and negative except where noted in the History of Present Illness.  PHYSICAL EXAM: There were no vitals taken for this visit. Physical Exam          Doree Albee, PA-C 8:04 AM

## 2023-12-12 ENCOUNTER — Telehealth: Payer: Self-pay | Admitting: Gastroenterology

## 2023-12-12 ENCOUNTER — Other Ambulatory Visit: Payer: Self-pay | Admitting: Gastroenterology

## 2023-12-12 ENCOUNTER — Telehealth: Payer: Self-pay | Admitting: Physician Assistant

## 2023-12-12 DIAGNOSIS — K9089 Other intestinal malabsorption: Secondary | ICD-10-CM

## 2023-12-12 MED ORDER — CHOLESTYRAMINE 4 G PO PACK: PACK | ORAL | 0 refills | Status: AC

## 2023-12-12 NOTE — Telephone Encounter (Signed)
 Called patient  to review lab work and abdominal xray. Recommend she follow-up with her nephrologist- history of kidney stones, Tineshia Becraft be why she has blood in urine.   She states Hyoscyamine not working for abdominal cramping and diarrhea prescribed by Marchelle Folks at last visit. She is currently taking bentyl and Mylanta pretty regularly but states she still has severe abdominal discomfort.  Patient has had abdominal cramping and diarrhea for last 5 days.  She told me at beginning of conversation migraine medicine gives her constipation but then at end of conversation states she took it last night and thinks it caused her diarrhea..  Patient also has nausea, she has antiemetics.   Her PCP recently put her back on glipizide for uncontrolled blood sugars. She is on several medications that could potentially cause nausea including glipizide.  Recommended she go to urgent care if pain is that severe.  We discussed other options. She cannot recall ever using cholestyramine and has issues for several years. Agrees to try half dose at bedtime.

## 2023-12-12 NOTE — Progress Notes (Signed)
 Discussed with patient trialing cholestyramine for diarrhea.

## 2023-12-12 NOTE — Telephone Encounter (Signed)
 X-ray report has not been read. I have called radiology reading room and requested that x-ray be read ASAP. Thanks

## 2023-12-12 NOTE — Telephone Encounter (Signed)
 Patient called very upset and stated that she would like to speak to the nurse regarding diarrhea and stomach cramp.Patient also stated that when she saw Marchelle Folks last time she had placed her on the same medication Dr. Chales Abrahams did, and left like she was not heard or understood because that medication was not working for her symptoms. Patient was also upset that no one had called her about her lab and x- ray results she had. Patient stated that she does not do messages on mychart. Patient is requesting that some one calls her back today. Please advise.

## 2023-12-18 DIAGNOSIS — N200 Calculus of kidney: Secondary | ICD-10-CM | POA: Diagnosis not present

## 2023-12-18 DIAGNOSIS — N130 Hydronephrosis with ureteropelvic junction obstruction: Secondary | ICD-10-CM | POA: Diagnosis not present

## 2023-12-19 ENCOUNTER — Other Ambulatory Visit: Payer: Self-pay | Admitting: Urology

## 2023-12-19 DIAGNOSIS — R8271 Bacteriuria: Secondary | ICD-10-CM | POA: Diagnosis not present

## 2023-12-20 ENCOUNTER — Encounter (HOSPITAL_COMMUNITY): Payer: Self-pay | Admitting: Urology

## 2023-12-20 NOTE — Progress Notes (Signed)
 Spoke w/ via phone for pre-op interview---[facility] needs dos----KUB               COVID test -----patient states asymptomatic no test needed Arrive at -------0915 NPO after MN NO Solid Food.  Clear liquids from MN until---0300 Med rec completed. Pt aware to hold ASA/NSAIDs and supplements per [facility] protocol.  Medications to take morning of surgery -----Atenolol, Baclofen, Celexa, Keppra Lisonopril,Prilosec Diabetic/Weight loss medication ----- No Alcohol or recreational drugs for 24 hours/Tobacco products for 6 hours ---- Patient instructed to bring blue lithotripsy folder, photo id and insurance card day of surgery. Patient aware to have Driver (ride ) / caregiver for 24 hours after surgery -----[name] [phone] Patient Special Instructions ----- Pre-Op special Instructions ----- take laxative of choice day before procedure Patient verbalized understanding of instructions that were given at this phone interview. Patient denies shortness of breath, chest pain, fever, cough at this phone interview.

## 2023-12-22 ENCOUNTER — Encounter (HOSPITAL_COMMUNITY)
Admission: RE | Disposition: A | Discharge status: Home / Self Care | Payer: Self-pay | Source: Home / Self Care | Attending: Urology

## 2023-12-22 ENCOUNTER — Ambulatory Visit (HOSPITAL_COMMUNITY)

## 2023-12-22 ENCOUNTER — Other Ambulatory Visit: Payer: Self-pay

## 2023-12-22 ENCOUNTER — Ambulatory Visit (HOSPITAL_COMMUNITY)
Admission: RE | Admit: 2023-12-22 | Discharge: 2023-12-22 | Disposition: A | Discharge status: Home / Self Care | Attending: Urology | Admitting: Urology

## 2023-12-22 ENCOUNTER — Encounter (HOSPITAL_COMMUNITY): Payer: Self-pay | Admitting: Urology

## 2023-12-22 DIAGNOSIS — I10 Essential (primary) hypertension: Secondary | ICD-10-CM | POA: Diagnosis not present

## 2023-12-22 DIAGNOSIS — K449 Diaphragmatic hernia without obstruction or gangrene: Secondary | ICD-10-CM | POA: Diagnosis not present

## 2023-12-22 DIAGNOSIS — E119 Type 2 diabetes mellitus without complications: Secondary | ICD-10-CM | POA: Diagnosis not present

## 2023-12-22 DIAGNOSIS — N132 Hydronephrosis with renal and ureteral calculous obstruction: Secondary | ICD-10-CM | POA: Insufficient documentation

## 2023-12-22 DIAGNOSIS — J45909 Unspecified asthma, uncomplicated: Secondary | ICD-10-CM | POA: Diagnosis not present

## 2023-12-22 DIAGNOSIS — N200 Calculus of kidney: Secondary | ICD-10-CM | POA: Diagnosis not present

## 2023-12-22 DIAGNOSIS — N201 Calculus of ureter: Secondary | ICD-10-CM | POA: Diagnosis not present

## 2023-12-22 HISTORY — PX: EXTRACORPOREAL SHOCK WAVE LITHOTRIPSY: SHX1557

## 2023-12-22 LAB — GLUCOSE, CAPILLARY
Glucose-Capillary: 96 mg/dL (ref 70–99)
Glucose-Capillary: 90 mg/dL (ref 70–99)

## 2023-12-22 SURGERY — LITHOTRIPSY, ESWL
Anesthesia: LOCAL | Laterality: Left

## 2023-12-22 MED ORDER — HYDROCODONE-ACETAMINOPHEN 5-325 MG PO TABS: 1.0000 | ORAL_TABLET | Freq: Four times a day (QID) | ORAL | 0 refills | Status: AC | PRN

## 2023-12-22 MED ORDER — DIAZEPAM 5 MG PO TABS
10.0000 mg | ORAL_TABLET | ORAL | Status: AC
  Administered 2023-12-22: 10 mg via ORAL
  Filled 2023-12-22: qty 2

## 2023-12-22 MED ORDER — SODIUM CHLORIDE 0.9 % IV SOLN
INTRAVENOUS | Status: DC
  Administered 2023-12-22: 1000 mL via INTRAVENOUS

## 2023-12-22 MED ORDER — CIPROFLOXACIN HCL 500 MG PO TABS
500.0000 mg | ORAL_TABLET | ORAL | Status: AC
  Administered 2023-12-22: 500 mg via ORAL
  Filled 2023-12-22: qty 1

## 2023-12-22 MED ORDER — DIPHENHYDRAMINE HCL 25 MG PO CAPS
25.0000 mg | ORAL_CAPSULE | ORAL | Status: AC
  Administered 2023-12-22: 25 mg via ORAL
  Filled 2023-12-22: qty 1

## 2023-12-22 NOTE — H&P (Signed)
 /HPI: CC: Left nephrolithiasis, hydronephrosis, UPJ obstruction  HPI:  11/13/2020  68 year old female presents for evaluation of the above. She underwent a CT scan of the abdomen and pelvis with contrast for diarrhea and abdominal pain. This revealed nonobstructive left renal calculi with a duplicated collecting system. The left lower pole moiety had partial obstruction an wall thickening at the UPJ. Neoplasm could not be excluded versus stricture. She denies any complaints today. Urinalysis negative today.   01/21/2021  Patient is status post left ureteroscopy with laser lithotripsy and ureteral stent placement. 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 Upper pole moiety was normal. Lower pole moiety had some hydronephrosis. Ureteroscopy revealed an open ureteropelvic junction and no dilation was needed. Laser lithotripsy was performed of some stones in the lower pole moiety. She has been doing well since stent removal. She was having some constant incontinence from the stent migrating to the urethra but obviously this is better since stent removal. Urinalysis is negative for hematuria today. There was no hydronephrosis on the right. On the left, there was some possible calcifications. There was some mild dilation consistent with her chronic process that has been going on.   12/18/2023: Nicole Hicks is a 68 year old female who presents today with concerns of diarrhea, left lower abdominal pain, left upper flank pain, and lower back discomfort. She saw her GI physician who placed her on multiple medications but none of them are helping. She also had a KUB which showed a 7 mm left renal stone. Today she has blood in her urine     ALLERGIES: Bactrim - Hives    MEDICATIONS: Aspirin  Prilosec  Atenolol  Baclofen  Belsomra  Celexa  Claritin  Gabapentin  Glimepiride 2 MG Tablet  Jardiance     GU PSH: Ureteroscopic laser litho, Left - 2022     NON-GU PSH:  Cholecystectomy (laparoscopic) Hysterectomy     GU PMH: Hydronephrosis - 2022, - 2022, - 2022 Renal calculus - 2022, - 2022, - 2022 Ureteral stricture - 2022    NON-GU PMH: Anxiety Arthritis Asthma Diabetes Type 2 Heart disease, unspecified Hypercholesterolemia Hypertension Stroke/TIA    FAMILY HISTORY: 2 daughters - Other   SOCIAL HISTORY: Marital Status: Divorced Preferred Language: English; Race: White Current Smoking Status: Patient has never smoked.   Tobacco Use Assessment Completed: Used Tobacco in last 30 days? Drinks 1 caffeinated drink per day.    REVIEW OF SYSTEMS:    GU Review Female:   Patient denies frequent urination, hard to postpone urination, burning /pain with urination, get up at night to urinate, leakage of urine, stream starts and stops, trouble starting your stream, have to strain to urinate, and being pregnant.  Gastrointestinal (Upper):   Patient reports nausea and vomiting. Patient denies indigestion/ heartburn.  Gastrointestinal (Lower):   Patient reports diarrhea. Patient denies constipation.  Constitutional:   Patient denies fever, night sweats, weight loss, and fatigue.  Skin:   Patient denies skin rash/ lesion and itching.  Eyes:   Patient denies blurred vision and double vision.  Ears/ Nose/ Throat:   Patient denies sore throat and sinus problems.  Hematologic/Lymphatic:   Patient denies swollen glands and easy bruising.  Cardiovascular:   Patient denies leg swelling and chest pains.  Respiratory:   Patient denies cough and shortness of breath.  Endocrine:   Patient denies excessive thirst.  Musculoskeletal:   Patient denies back pain and joint pain.  Neurological:   Patient denies headaches and dizziness.  Psychologic:   Patient denies depression and anxiety.   Notes: L flank pain    VITAL SIGNS:      12/18/2023 12:45 PM  BP 157/86 mmHg  Heart Rate 82 /min  Temperature 97.3 F / 36.2 C   MULTI-SYSTEM PHYSICAL EXAMINATION:     Constitutional: Well-nourished. No physical deformities. Normally developed. Good grooming.  Cardiovascular: Normal temperature, normal extremity pulses, no swelling, no varicosities.  Skin: No paleness, no jaundice, no cyanosis. No lesion, no ulcer, no rash.  Neurologic / Psychiatric: Oriented to time, oriented to place, oriented to person. No depression, no anxiety, no agitation.  Gastrointestinal: No mass, no tenderness, no rigidity, non obese abdomen.     Complexity of Data:  Source Of History:  Patient  Records Review:   Previous Doctor Records, Previous Patient Records  Urine Test Review:   Urinalysis  X-Ray Review: KUB: Reviewed Films. Reviewed Report. Discussed With Patient.     12/18/23  Urinalysis  Urine Appearance Slightly Cloudy   Urine Color Yellow   Urine Glucose Neg mg/dL  Urine Bilirubin Neg mg/dL  Urine Ketones Neg mg/dL  Urine Specific Gravity 1.025   Urine Blood 3+ ery/uL  Urine pH 7.0   Urine Protein 3+ mg/dL  Urine Urobilinogen 0.2 mg/dL  Urine Nitrites Neg   Urine Leukocyte Esterase Neg leu/uL  Urine WBC/hpf 0 - 5/hpf   Urine RBC/hpf 40 - 60/hpf   Urine Epithelial Cells 0 - 5/hpf   Urine Bacteria Few (10-25/hpf)   Urine Mucous Present   Urine Yeast NS (Not Seen)   Urine Trichomonas Not Present   Urine Cystals NS (Not Seen)   Urine Casts NS (Not Seen)   Urine Sperm Not Present    PROCEDURES:         KUB - [id number]  A single view of the abdomen is obtained. Bilateral renal shadows are visualized. Within what I believe is the left renal pelvis at the level of L3 there is a 7 mm well-visualized opacity.      Patient confirmed No Neulasta OnPro Device.           Visit Complexity - G2211          Urinalysis w/Scope Dipstick Dipstick Cont'd Micro  Color: Yellow Bilirubin: Neg mg/dL WBC/hpf: 0 - 5/hpf  Appearance: Slightly Cloudy Ketones: Neg mg/dL RBC/hpf: 40 - 46/NGE  Specific Gravity: 1.025 Blood: 3+ ery/uL Bacteria: Few (10-25/hpf)  pH: 7.0  Protein: 3+ mg/dL Cystals: NS (Not Seen)  Glucose: Neg mg/dL Urobilinogen: 0.2 mg/dL Casts: NS (Not Seen)    Nitrites: Neg Trichomonas: Not Present    Leukocyte Esterase: Neg leu/uL Mucous: Present      Epithelial Cells: 0 - 5/hpf      Yeast: NS (Not Seen)      Sperm: Not Present    ASSESSMENT:      ICD-10 Details  1 GU:   Hydronephrosis - N13.0 Left, Acute, Uncomplicated  2   Renal calculus - N20.0 Left, Acute, Uncomplicated   PLAN:           Orders Labs CULTURE, URINE  X-Rays: KUB          Schedule         Document Letter(s):  Created for Patient: Clinical Summary         Notes:   Her stone is well-visualized on KUB at the level of L3. This measures 7 mm. Urinalysis will be sent for precautionary culture today. Stone intervention was discussed in detail today.  For ureteroscopy, the patient understands that there is a chance for a staged procedure. Patient also understands that there is risk for bleeding, infection, injury to surrounding organs, and general risks of anesthesia. The patient also understands the placement of a stent and the risks of stent placement including, risk for infection, the risk for pain, and the risk for injury. For ESWL, the patient understands that there is a chance of failure of procedure, there is also a risk for bruising, infection, bleeding, and injury to surrounding structures. The patient verbalized understanding to these risks. She would like to proceed with lithotripsy.

## 2023-12-22 NOTE — Op Note (Signed)
 See [employer] OP note scanned into chart. Also because of the size, density, location and other factors that cannot be anticipated I feel this will likely be a staged procedure. This fact supersedes any indication in the scanned [place] stone operative note to the contrary.

## 2023-12-22 NOTE — Discharge Instructions (Addendum)
 See [facility] discharge instructions in chart.   Post Anesthesia Home Care Instructions  Activity: Get plenty of rest for the remainder of the day. A responsible individual must stay with you for 24 hours following the procedure.  For the next 24 hours, DO NOT: -Drive a car -Advertising copywriter -Drink alcoholic beverages -Take any medication unless instructed by your physician -Make any legal decisions or sign important papers.  Meals: Start with liquid foods such as gelatin or soup. Progress to regular foods as tolerated. Avoid greasy, spicy, heavy foods. If nausea and/or vomiting occur, drink only clear liquids until the nausea and/or vomiting subsides. Call your physician if vomiting continues.  Special Instructions/Symptoms: Your throat may feel dry or sore from the anesthesia or the breathing tube placed in your throat during surgery. If this causes discomfort, gargle with warm salt water. The discomfort should disappear within 24 hours.

## 2023-12-25 ENCOUNTER — Encounter (HOSPITAL_COMMUNITY): Payer: Self-pay | Admitting: Urology

## 2024-01-03 DIAGNOSIS — R0683 Snoring: Secondary | ICD-10-CM | POA: Diagnosis not present

## 2024-01-03 DIAGNOSIS — Z8673 Personal history of transient ischemic attack (TIA), and cerebral infarction without residual deficits: Secondary | ICD-10-CM | POA: Diagnosis not present

## 2024-01-03 DIAGNOSIS — G478 Other sleep disorders: Secondary | ICD-10-CM | POA: Diagnosis not present

## 2024-01-03 DIAGNOSIS — R519 Headache, unspecified: Secondary | ICD-10-CM | POA: Diagnosis not present

## 2024-01-03 DIAGNOSIS — M542 Cervicalgia: Secondary | ICD-10-CM | POA: Diagnosis not present

## 2024-01-03 DIAGNOSIS — G43709 Chronic migraine without aura, not intractable, without status migrainosus: Secondary | ICD-10-CM | POA: Diagnosis not present

## 2024-01-03 DIAGNOSIS — Z79899 Other long term (current) drug therapy: Secondary | ICD-10-CM | POA: Diagnosis not present

## 2024-01-03 DIAGNOSIS — R400 Somnolence: Secondary | ICD-10-CM | POA: Diagnosis not present

## 2024-01-03 DIAGNOSIS — R269 Unspecified abnormalities of gait and mobility: Secondary | ICD-10-CM | POA: Diagnosis not present

## 2024-01-03 DIAGNOSIS — R270 Ataxia, unspecified: Secondary | ICD-10-CM | POA: Diagnosis not present

## 2024-01-03 DIAGNOSIS — R296 Repeated falls: Secondary | ICD-10-CM | POA: Diagnosis not present

## 2024-01-04 DIAGNOSIS — N200 Calculus of kidney: Secondary | ICD-10-CM | POA: Diagnosis not present

## 2024-01-17 DIAGNOSIS — G43719 Chronic migraine without aura, intractable, without status migrainosus: Secondary | ICD-10-CM | POA: Diagnosis not present

## 2024-01-30 ENCOUNTER — Ambulatory Visit: Admitting: Physician Assistant

## 2024-01-30 NOTE — Progress Notes (Deleted)
 01/30/2024 Nicole Hicks [id number] 06-10-1956  Referring provider: Beecher Bower, MD Primary GI doctor: Dr. Venice Hicks  ASSESSMENT AND PLAN:   Epigastric Ab pain, GERD, diarrhea Status post cholecystectomy 5 CM HH but resolved gastric ulcer on EGD 06/2023 Negative complements, CRP, no vasculitis, negative porphobilinogen,  Vascular visit, not from SMA stenosis, no associated weight loss  Pain had improved until recently, occurring every 6 months  Diarrhea 3-5 x a day but with her migraine medications she has constipation. No BM x 2-3 days ? AB migraines, currently getting infusions with neuro has some nausea with movement- follow up neuro ?constipation with previous KUB showing this, will get KUB- suspicious for IBS-C No AB wall pain  Left renal stone Seen on KUB  History of CVA and superior mesenteric artery stenosis  Type 2 diabetes  Personal history of colon polyps  Patient Care Team: Nicole Bower, MD as PCP - General (Family Medicine) Nicole Pila, MD as Consulting Physician (Gastroenterology)  HISTORY OF PRESENT ILLNESS: 68 y.o. female with a past medical history of arthritis, asthma, hyperlipidemia, hypertension, superior mesenteric artery stenosis, CVA, diabetes mellitus type 2, pernicious anemia, IBS, diverticulitis, colon polyps and GERD.  S/p cholecystectomy 2012. and others listed below presents for evaluation of AB pain, nausea and diarrhea.   Patient was seen August 15th 2024 with complaint of central abdominal pain with Nicole Hitt, NP there was concern for  mesenteric artery stenosis, abdominal angioedema. Patient had normal CRP normal complements unremarkable prophobilirubin. EGD with Dr. Venice Hicks 06/28/2023 showed completely healed gastric ulcer and moderate hiatal hernia 5 cm no Cameron erosions. KUB showed constipation. 08/01/2023 patient had office visit with Dr. Vinetta Hicks vascular surgery.  Who felt the mesenteric artery stenosis was not  causing patient's symptoms.  And continue medical management for atherosclerosis. Patient does have history of chronic migraines and follows with Dr. Alejos Hicks with [facility], getting infusions of VYEPTI  .  discussed the use of AI scribe software for clinical note transcription with the patient, who gave verbal consent to proceed.  History of Present Illness            She  reports that she has never smoked. She has never used smokeless tobacco. She reports that she does not currently use alcohol. She reports that she does not use drugs.  RELEVANT GI HISTORY, IMAGING AND LABS: CT Abdo/pelvis 01/12/2023: -Chronic high-grade proximal SMA stenosis. -Small kidney stones -Small HH -Otherwise normal  CTAP 04/18/2023:   FINDINGS: Lower Chest: No acute findings.   Hepatobiliary: No suspicious hepatic masses identified. Prior cholecystectomy. No evidence of biliary obstruction.   Pancreas:  No mass or inflammatory changes.   Spleen: Within normal limits in size and appearance.   Adrenals/Urinary Tract: No suspicious masses identified. 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 6 mm calculus seen in lower pole of left kidney, and 3 mm calculus seen in the left renal pelvis. Mild left renal pelvicaliectasis and ureterectasis remains stable, and no obstructing ureteral calculi are identified. Unremarkable unopacified urinary bladder.   Stomach/Bowel: Tiny hiatal hernia again noted. No evidence of obstruction, inflammatory process or abnormal fluid collections.   Vascular/Lymphatic: No pathologically enlarged lymph nodes. No acute vascular findings.   Reproductive:  No mass or other significant abnormality.   Other:  None.   Musculoskeletal:  No suspicious bone lesions identified.   IMPRESSION: Stable mild left renal pelvicaliectasis and ureterectasis. No ureteral calculi or other obstructing etiology visualized by CT. Left nephrolithiasis. Tiny hiatal  hernia.    CT Abdo/pelvis with contrast 09/2020: 1. No acute findings or explanation for diarrhea. 2. Nonspecific heterogeneous hepatic attenuation which may relate to steatosis or regenerating nodules. Correlate with liver function studies. No biliary dilatation post cholecystectomy. 3. Nonobstructive left renal calculi. 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 This could be secondary to a stricture or neoplasm. Urology consultation and correlation with urine analysis and urine cytology recommended. 4. Aortic Atherosclerosis (ICD10-I70.0). 5. These results will be called to the ordering clinician or representative by the Radiologist Assistant, and communication documented in the PACS or [employer].   PAST GI PROCEDURES:   EGD 06/2023  - The lower third of the esophagus was mildly tortuous. - A 5 cm hiatal hernia was present as previously noted. No [place] erosions. - Gastric ulcer had completely healed. Gastric mucosa was normal. A few ( 4- 5) diminutive sessile polyps ( previously deemed to be fundic gland polyps) with no bleeding and no stigmata of recent bleeding were found in the gastric body. - The examined duodenum was normal. - The exam was otherwise without abnormality. EGD 04/19/2023 by Dr. Venice Hicks: - 5 cm hiatal hernia.  - Non-bleeding gastric ulcer with no stigmata of bleeding. Biopsied.  - A few gastric polyps. (Resected x 3). 1. Surgical [P], small bowel - DUODENAL MUCOSA WITH NO SIGNIFICANT PATHOLOGY. 2. Surgical [P], gastric ulcer - ANTRAL/OXYNTIC MUCOSA WITH CHEMICAL/REACTIVE/REPARATIVE CHANGE, LAMINA PROPRIA EDEMA AND FOCAL EVIDENCE OF EROSION. - NO HELICOBACTER PYLORI ORGANISMS IDENTIFIED ON H&E STAINED SLIDE. - NEGATIVE FOR MALIGNANCY. 3. Surgical [P], gastric polyp, polyp (3) - FUNDIC GLAND POLYPS.   Colonoscopy  09/2015: Mild sigmoid diverticulosis,  normal TI No polyps   Colonoscopy 01/2009: Colon polyps removed  CBC    Component Value Date/Time   WBC 6.2 11/29/2023 1453   RBC 4.57 11/29/2023 1453   HGB 14.3 11/29/2023 1453   HCT 42.7 11/29/2023 1453   PLT 297.0 11/29/2023 1453   MCV 93.4 11/29/2023 1453   MCHC 33.4 11/29/2023 1453   RDW 13.3 11/29/2023 1453   LYMPHSABS 1.6 11/29/2023 1453   MONOABS 0.5 11/29/2023 1453   EOSABS 0.0 11/29/2023 1453   BASOSABS 0.1 11/29/2023 1453   Recent Labs    05/04/23 1020 11/29/23 1453  HGB 15.0 14.3    CMP     Component Value Date/Time   NA 138 11/29/2023 1453   K 3.9 11/29/2023 1453   CL 104 11/29/2023 1453   CO2 26 11/29/2023 1453   GLUCOSE 148 (H) 11/29/2023 1453   BUN 18 11/29/2023 1453   CREATININE 0.62 11/29/2023 1453   CALCIUM  9.7 11/29/2023 1453   PROT 6.7 11/29/2023 1453   ALBUMIN 4.3 11/29/2023 1453   AST 15 11/29/2023 1453   ALT 13 11/29/2023 1453   ALKPHOS 96 11/29/2023 1453   BILITOT 0.5 11/29/2023 1453      Latest Ref Rng & Units 11/29/2023    2:53 PM 05/04/2023   10:20 AM  Hepatic Function  Total Protein 6.0 - 8.3 g/dL 6.7  6.2   Albumin 3.5 - 5.2 g/dL 4.3  4.0   AST 0 - 37 U/L 15  15   ALT 0 - 35 U/L 13  16   Alk Phosphatase 39 - 117 U/L 96  55   Total Bilirubin 0.2 - 1.2 mg/dL 0.5  0.9       Current Medications:   Current  Outpatient Medications (Endocrine & Metabolic):    glimepiride (AMARYL) 2 MG tablet, Take 2 mg by mouth daily with breakfast.   glipiZIDE (GLUCOTROL XL) 2.5 MG 24 hr tablet, Take 2.5 mg by mouth daily with breakfast.   pioglitazone (ACTOS) 30 MG tablet, Take 30 mg by mouth daily.  Current Outpatient Medications (Cardiovascular):    atenolol (TENORMIN) 50 MG tablet, Take 50 mg by mouth daily.   atorvastatin  (LIPITOR) 80 MG tablet, SMARTSIG:1 Tablet(s) By Mouth Every Evening   cholestyramine  (QUESTRAN ) 4 g packet, Take 1/2 packet at bedtime po daily, if it causes constipation skip to  every other day   fenofibrate  54 MG tablet, Take 54 mg by mouth daily. (Patient not taking: Reported on 11/29/2023)   lisinopril  (ZESTRIL ) 2.5 MG tablet, Take 2.5 mg by mouth at bedtime.   Metoprolol Succinate 25 MG CS24, Take 25 mg by mouth daily. (Patient not taking: Reported on 11/29/2023)  Current Outpatient Medications (Respiratory):    loratadine (CLARITIN) 10 MG tablet, Take 10 mg by mouth daily.   montelukast  (SINGULAIR ) 10 MG tablet, Take 10 mg by mouth at bedtime.   promethazine  (PHENERGAN ) 25 MG tablet, Take 25 mg by mouth every 8 (eight) hours as needed.  Current Outpatient Medications (Analgesics):    aspirin  EC 81 MG tablet, Take 81 mg by mouth daily. Swallow whole.   butalbital-acetaminophen -caffeine (FIORICET) 50-325-40 MG tablet, Take 1 tablet by mouth every 8 (eight) hours as needed.   Erenumab-aooe 70 MG/ML SOAJ, Inject 1 pen into the skin every 30 (thirty) days. (Patient not taking: Reported on 11/29/2023)   HYDROcodone -acetaminophen  (NORCO/VICODIN) 5-325 MG tablet, Take 1 tablet by mouth every 6 (six) hours as needed for moderate pain (pain score 4-6).   naproxen (NAPROSYN) 500 MG tablet, Take 500 mg by mouth 2 (two) times daily as needed. (Patient not taking: Reported on 05/11/2023)   Current Outpatient Medications (Other):    ALPRAZolam (XANAX) 0.5 MG tablet, Take 0.5 mg by mouth at bedtime as needed for anxiety.   Ascorbic Acid (VITAMIN C PO), Take by mouth daily.   baclofen  (LIORESAL ) 10 MG tablet, Take 5-10 mg by mouth 2 (two) times daily as needed for muscle spasms.   BELSOMRA 15 MG TABS, Take 1 tablet by mouth at bedtime.   Biotin 5000 MCG TABS, Take 5,000 mcg by mouth daily.   Cholecalciferol (VITAMIN D3) 2000 units TABS, Take 2,000 mg by mouth daily.   citalopram  (CELEXA ) 20 MG tablet, Take 40 mg by mouth daily.   dicyclomine  (BENTYL ) 20 MG tablet, Take 20 mg by mouth as needed.   diphenoxylate -atropine  (LOMOTIL ) 2.5-0.025 MG tablet, Take 1 tablet by mouth 4  (four) times daily as needed for diarrhea or loose stools. (Patient not taking: Reported on 11/29/2023)   gabapentin  (NEURONTIN ) 100 MG capsule, Take 100 mg by mouth at bedtime.   gabapentin  (NEURONTIN ) 300 MG capsule, Take 300 mg by mouth at bedtime.   hyoscyamine  (LEVBID) 0.375 MG 12 hr tablet, Take 1 tablet (0.375 mg total) by mouth 2 (two) times daily. Continue for 2 more weeks twice a day then reduce to once daily for 2 weeks the stop.   levETIRAcetam (KEPPRA) 500 MG tablet, Take 500 mg by mouth as directed.   memantine (NAMENDA) 10 MG tablet, Take 10 mg by mouth 2 (two) times daily. (Patient not taking: Reported on 11/29/2023)   Multiple Vitamin (MULTIVITAMIN) tablet, Take 1 tablet by mouth daily.   omeprazole (PRILOSEC) 20 MG capsule, Take 20 mg by mouth 2 (  two) times daily as needed.   ondansetron  (ZOFRAN ) 8 MG tablet, Take 8 mg by mouth as needed for nausea or vomiting.   pantoprazole  (PROTONIX ) 40 MG tablet, Take 1 tablet (40 mg total) by mouth daily.   pantoprazole  (PROTONIX ) 40 MG tablet, Take 1 tablet (40 mg total) by mouth 2 (two) times daily before a meal.   ramelteon (ROZEREM) 8 MG tablet, Take 8 mg by mouth as directed.   rOPINIRole (REQUIP) 0.5 MG tablet, Take 0.5 mg by mouth as directed.   sucralfate  (CARAFATE ) 1 g tablet, Take 1 tablet (1 g total) by mouth 2 (two) times daily. Do not take within 2 hours of any other medications.  Medical History:  Past Medical History:  Diagnosis Date   Allergy    Arthritis    hands and feet   Asthma    Bell's palsy    2 episodes   Bronchospasm    Diabetes mellitus without complication (HCC)    Diverticulitis    GERD (gastroesophageal reflux disease)    History of colitis    History of colon polyps    Hyperlipidemia    Hypertension    IBS (irritable bowel syndrome)    Insomnia    Migraines    Osteoarthritis    Pernicious anemia    Stroke (HCC) 11/12/2019   Allergies:  Allergies  Allergen Reactions   Meloxicam Hives    Onabotulinumtoxina Swelling    Face swelling, ptosis    Penicillins Hives and Rash   Sulfamethoxazole-Trimethoprim Hives   Cefdinir Rash   Estrogens Other (See Comments)    Stop estrogens due to right parietal lobe ischemic stroke. Stop estrogens due to right parietal lobe ischemic stroke.    Telithromycin Rash   Topiramate  Other (See Comments)    Weight loss and metallic taste   Umeclidinium-Vilanterol Hives   Ultram [Tramadol Hcl] Hives   Hydrocodone -Acetaminophen  Itching   Tamiflu [Oseltamivir] Rash    Red face   Tape Rash    Surgical tape     Surgical History:  She  has a past surgical history that includes Colonoscopy (10/14/2015); Cholecystectomy (12/2010); Abdominal hysterectomy (1992); Bunionectomy; Knee surgery; Cataract extraction; and Extracorporeal shock wave lithotripsy (Left, 12/22/2023). Family History:  Her family history includes Diabetes in her father and mother; Heart disease in her father and mother.  REVIEW OF SYSTEMS  : All other systems reviewed and negative except where noted in the History of Present Illness.  PHYSICAL EXAM: There were no vitals taken for this visit. Physical Exam          Edmonia Gottron, PA-C 7:38 AM

## 2024-02-05 DIAGNOSIS — M4802 Spinal stenosis, cervical region: Secondary | ICD-10-CM | POA: Diagnosis not present

## 2024-02-05 DIAGNOSIS — I6603 Occlusion and stenosis of bilateral middle cerebral arteries: Secondary | ICD-10-CM | POA: Diagnosis not present

## 2024-02-05 DIAGNOSIS — R270 Ataxia, unspecified: Secondary | ICD-10-CM | POA: Diagnosis not present

## 2024-02-05 DIAGNOSIS — R269 Unspecified abnormalities of gait and mobility: Secondary | ICD-10-CM | POA: Diagnosis not present

## 2024-02-05 DIAGNOSIS — R296 Repeated falls: Secondary | ICD-10-CM | POA: Diagnosis not present

## 2024-02-21 DIAGNOSIS — Z6826 Body mass index (BMI) 26.0-26.9, adult: Secondary | ICD-10-CM | POA: Diagnosis not present

## 2024-02-21 DIAGNOSIS — E1165 Type 2 diabetes mellitus with hyperglycemia: Secondary | ICD-10-CM | POA: Diagnosis not present

## 2024-02-21 DIAGNOSIS — R413 Other amnesia: Secondary | ICD-10-CM | POA: Diagnosis not present

## 2024-02-21 DIAGNOSIS — E559 Vitamin D deficiency, unspecified: Secondary | ICD-10-CM | POA: Diagnosis not present

## 2024-02-27 DIAGNOSIS — E119 Type 2 diabetes mellitus without complications: Secondary | ICD-10-CM | POA: Diagnosis not present

## 2024-02-27 DIAGNOSIS — Z7984 Long term (current) use of oral hypoglycemic drugs: Secondary | ICD-10-CM | POA: Diagnosis not present

## 2024-02-27 DIAGNOSIS — M722 Plantar fascial fibromatosis: Secondary | ICD-10-CM | POA: Diagnosis not present

## 2024-02-27 DIAGNOSIS — L84 Corns and callosities: Secondary | ICD-10-CM | POA: Diagnosis not present

## 2024-02-27 DIAGNOSIS — M89372 Hypertrophy of bone, left ankle and foot: Secondary | ICD-10-CM | POA: Diagnosis not present

## 2024-04-22 DIAGNOSIS — M19041 Primary osteoarthritis, right hand: Secondary | ICD-10-CM | POA: Diagnosis not present

## 2024-04-22 DIAGNOSIS — G43909 Migraine, unspecified, not intractable, without status migrainosus: Secondary | ICD-10-CM | POA: Diagnosis not present

## 2024-04-22 DIAGNOSIS — M19042 Primary osteoarthritis, left hand: Secondary | ICD-10-CM | POA: Diagnosis not present

## 2024-04-22 DIAGNOSIS — E1165 Type 2 diabetes mellitus with hyperglycemia: Secondary | ICD-10-CM | POA: Diagnosis not present

## 2024-04-22 DIAGNOSIS — R1013 Epigastric pain: Secondary | ICD-10-CM | POA: Diagnosis not present

## 2024-05-16 DIAGNOSIS — R059 Cough, unspecified: Secondary | ICD-10-CM | POA: Diagnosis not present

## 2024-05-30 DIAGNOSIS — L84 Corns and callosities: Secondary | ICD-10-CM | POA: Diagnosis not present

## 2024-05-30 DIAGNOSIS — M722 Plantar fascial fibromatosis: Secondary | ICD-10-CM | POA: Diagnosis not present

## 2024-05-30 DIAGNOSIS — E119 Type 2 diabetes mellitus without complications: Secondary | ICD-10-CM | POA: Diagnosis not present

## 2024-07-15 DIAGNOSIS — G43719 Chronic migraine without aura, intractable, without status migrainosus: Secondary | ICD-10-CM | POA: Diagnosis not present

## 2024-08-21 DIAGNOSIS — Z23 Encounter for immunization: Secondary | ICD-10-CM | POA: Diagnosis not present

## 2024-08-23 DIAGNOSIS — S6000XA Contusion of unspecified finger without damage to nail, initial encounter: Secondary | ICD-10-CM | POA: Diagnosis not present

## 2024-08-23 DIAGNOSIS — S62607A Fracture of unspecified phalanx of left little finger, initial encounter for closed fracture: Secondary | ICD-10-CM | POA: Diagnosis not present

## 2024-08-23 DIAGNOSIS — M79644 Pain in right finger(s): Secondary | ICD-10-CM | POA: Diagnosis not present
